# Patient Record
Sex: Male | Born: 1940 | Race: White | Hispanic: No | Marital: Married | Smoking: Current every day smoker
Health system: Southern US, Community
[De-identification: ages and names within clinical notes are randomized; demographics above are authoritative.]

## PROBLEM LIST (undated history)

## (undated) DIAGNOSIS — F419 Anxiety disorder, unspecified: Secondary | ICD-10-CM

## (undated) DIAGNOSIS — J45909 Unspecified asthma, uncomplicated: Secondary | ICD-10-CM

## (undated) DIAGNOSIS — I509 Heart failure, unspecified: Secondary | ICD-10-CM

## (undated) DIAGNOSIS — M1990 Unspecified osteoarthritis, unspecified site: Secondary | ICD-10-CM

## (undated) DIAGNOSIS — J439 Emphysema, unspecified: Secondary | ICD-10-CM

## (undated) DIAGNOSIS — K219 Gastro-esophageal reflux disease without esophagitis: Secondary | ICD-10-CM

## (undated) DIAGNOSIS — I959 Hypotension, unspecified: Secondary | ICD-10-CM

## (undated) DIAGNOSIS — E119 Type 2 diabetes mellitus without complications: Secondary | ICD-10-CM

## (undated) HISTORY — DX: Unspecified asthma, uncomplicated: J45.909

## (undated) HISTORY — DX: Gastro-esophageal reflux disease without esophagitis: K21.9

## (undated) HISTORY — DX: Anxiety disorder, unspecified: F41.9

## (undated) HISTORY — DX: Unspecified osteoarthritis, unspecified site: M19.90

## (undated) HISTORY — DX: Emphysema, unspecified: J43.9

## (undated) HISTORY — DX: Hypotension, unspecified: I95.9

## (undated) HISTORY — DX: Type 2 diabetes mellitus without complications: E11.9

## (undated) HISTORY — DX: Heart failure, unspecified: I50.9

---

## 2004-12-13 HISTORY — PX: HEMORRHOID SURGERY: SHX153

## 2010-03-24 ENCOUNTER — Ambulatory Visit: Payer: Self-pay | Admitting: Ophthalmology

## 2010-08-16 ENCOUNTER — Ambulatory Visit: Payer: Self-pay | Admitting: Internal Medicine

## 2013-01-17 ENCOUNTER — Ambulatory Visit: Payer: Self-pay | Admitting: Ophthalmology

## 2014-01-28 ENCOUNTER — Ambulatory Visit: Payer: Self-pay | Admitting: Nephrology

## 2014-08-15 ENCOUNTER — Ambulatory Visit: Payer: Self-pay | Admitting: Emergency Medicine

## 2014-10-20 IMAGING — CT CT ABD-PELV W/O CM
2 of 4 series · 16 of 46 positions shown, 18 images · non-contrast
Comparison: None.

CLINICAL DATA: Right renal cysts.

EXAM:
CT ABDOMEN AND PELVIS WITHOUT CONTRAST
TECHNIQUE: Multidetector CT imaging of the abdomen and pelvis was performed
following the standard protocol without intravenous contrast.

[Series 2: routine without · axial · non-contrast · 0.84mm/px · z∈[-1118,-658]mm · 13 of 102 slices shown, 15 images]
[im 5/102  soft-tissue]
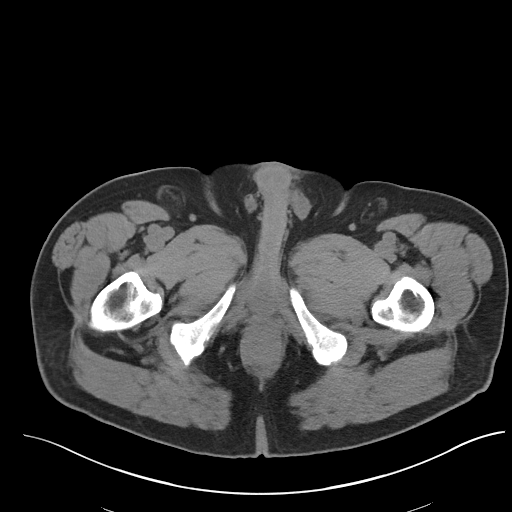
[im 5/102  bone]
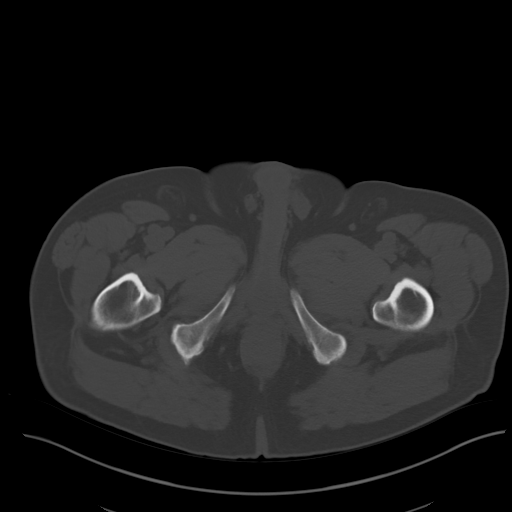
[im 14/102  soft-tissue]
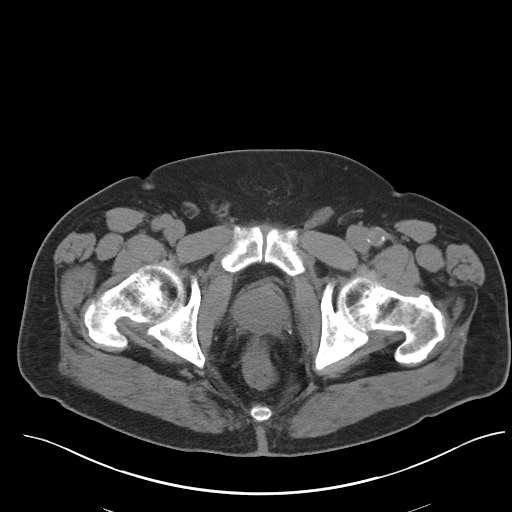
[im 22/102  soft-tissue]
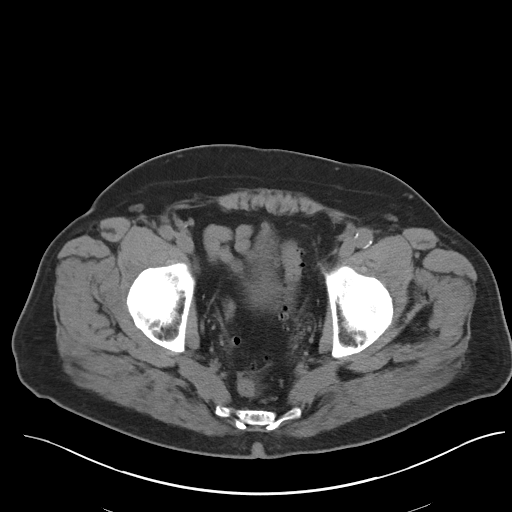
[im 27/102  soft-tissue]
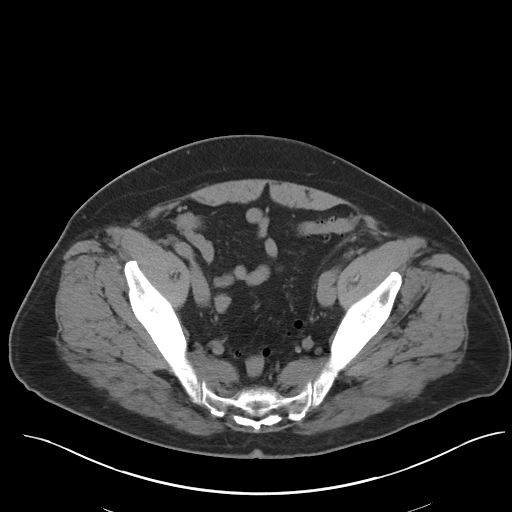
[im 36/102  soft-tissue]
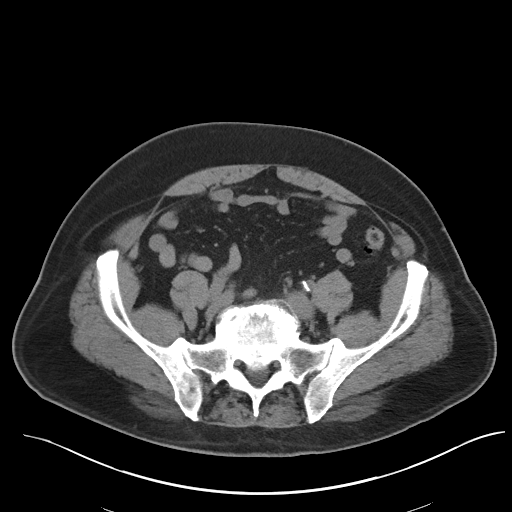
[im 44/102  soft-tissue]
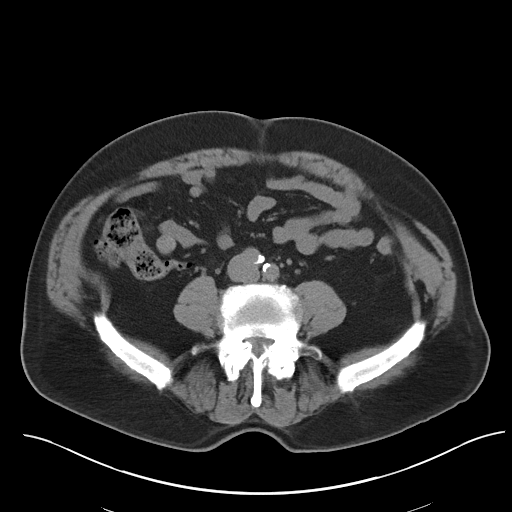
[im 53/102  soft-tissue]
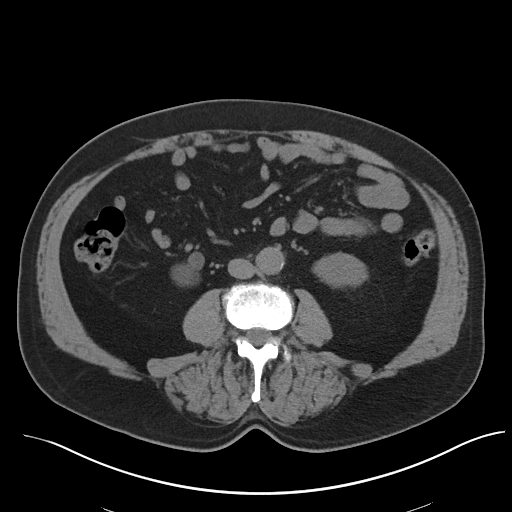
[im 58/102  soft-tissue]
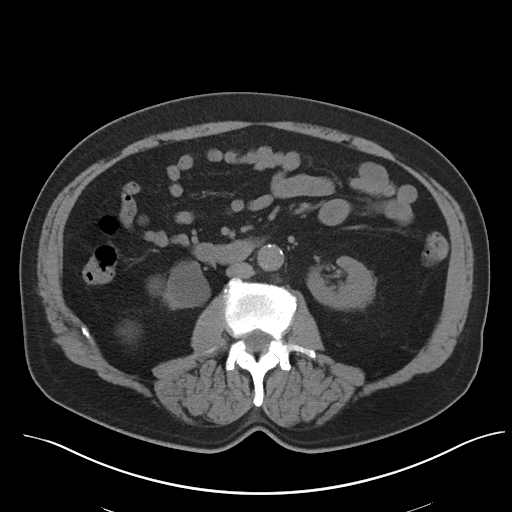
[im 66/102  soft-tissue]
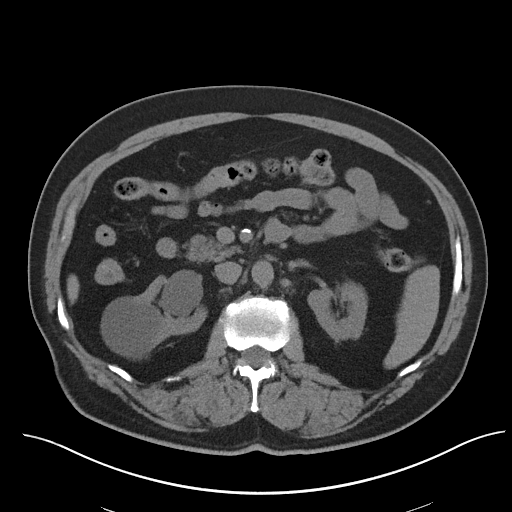
[im 66/102  bone]
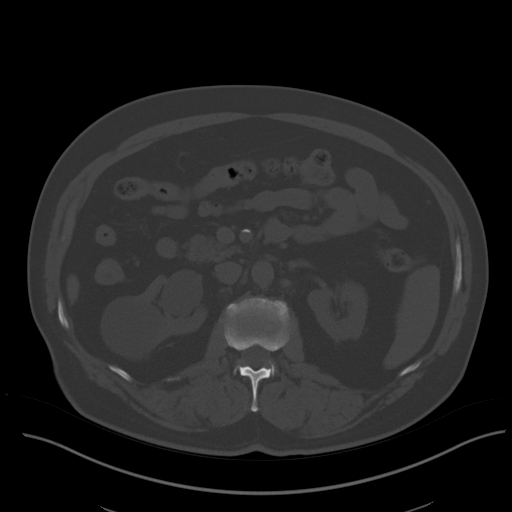
[im 75/102  soft-tissue]
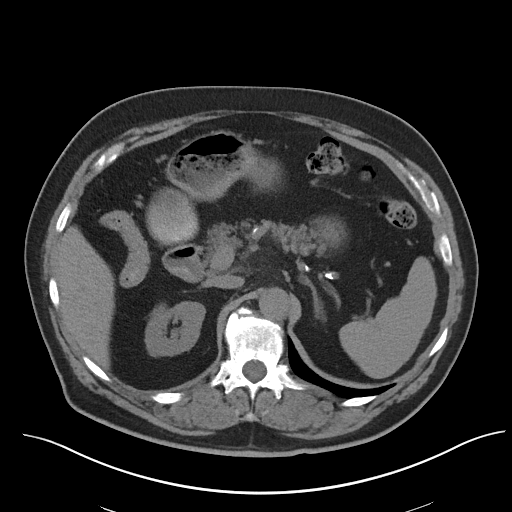
[im 80/102  soft-tissue]
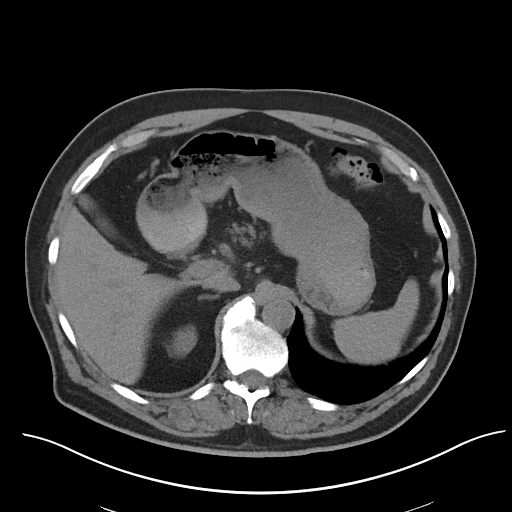
[im 88/102  soft-tissue]
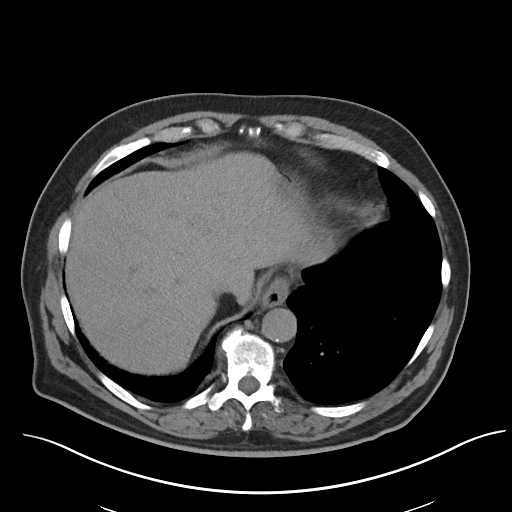
[im 97/102  soft-tissue]
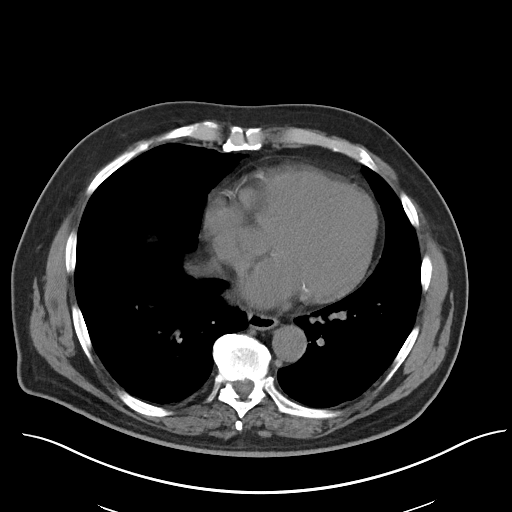

[Series 5: cor routine without · coronal · non-contrast · 0.88mm/px · 3 of 158 slices shown]
[im 53/158  soft-tissue]
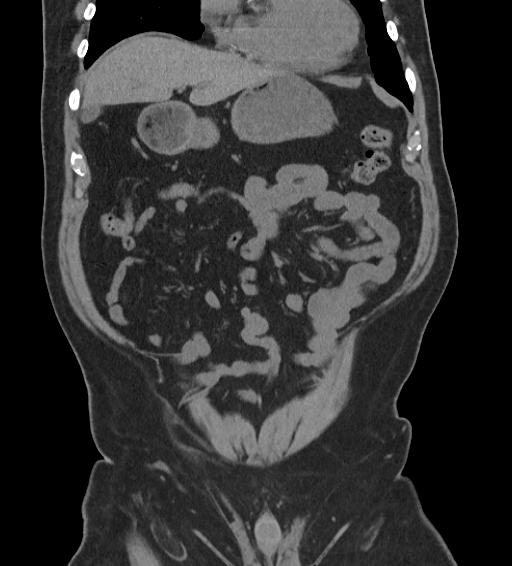
[im 70/158  soft-tissue]
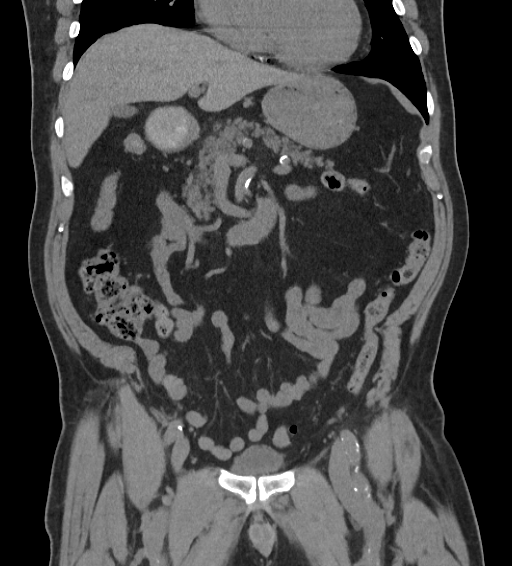
[im 88/158  soft-tissue]
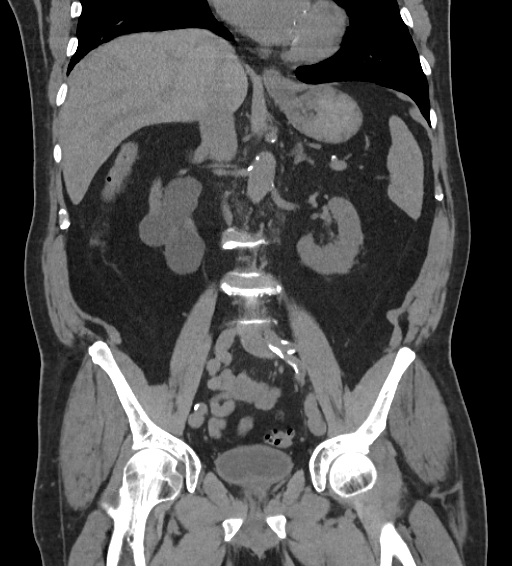

[16 of 46 positions shown; findings below may reference images not displayed]

FINDINGS: No focal abnormality is seen in the liver or spleen on this study
performed without intravenous contrast material. The stomach,
duodenum, pancreas, gallbladder, and adrenal glands are
unremarkable.

3.8 cm cystic lesion is identified in the central sinus of the right
kidney. 5.8 cm exophytic water density lesion is seen in the
interpolar right kidney. 2.6 cm exophytic cyst identified in the
lower pole of the right kidney. 4.3 cm cystic lesion identified at
the extreme lower pole of the right kidney.

No definite cysts are seen in the left kidney.

Insert athero aorta No abdominal lymphadenopathy. No free fluid in
the abdomen.

Imaging through the pelvis shows trace free intraperitoneal fluid.
No pelvic sidewall lymphadenopathy. Bladder is unremarkable.
Prostate gland is enlarged.

Diverticular changes are seen in the left colon without evidence of
diverticulitis. Terminal ileum is normal. The appendix is normal.

Bone windows reveal no worrisome lytic or sclerotic osseous lesions.
IMPRESSION: Multiple cystic lesions in the right kidney. These measure water
density but cannot be definitively characterized without intravenous
contrast material. Correlation to the previous ultrasound exam might
be useful to ensure stability in size.

Atherosclerosis of the abdominal aorta without aneurysm.

Colonic diverticulosis.

## 2014-10-23 DIAGNOSIS — I10 Essential (primary) hypertension: Secondary | ICD-10-CM | POA: Insufficient documentation

## 2014-10-23 DIAGNOSIS — K219 Gastro-esophageal reflux disease without esophagitis: Secondary | ICD-10-CM | POA: Insufficient documentation

## 2015-05-28 ENCOUNTER — Ambulatory Visit: Payer: Self-pay

## 2015-06-12 ENCOUNTER — Encounter: Payer: Self-pay | Admitting: Urology

## 2015-06-12 ENCOUNTER — Ambulatory Visit (INDEPENDENT_AMBULATORY_CARE_PROVIDER_SITE_OTHER): Payer: PPO | Admitting: Urology

## 2015-06-12 VITALS — BP 115/72 | HR 69 | Ht 72.0 in | Wt 215.9 lb

## 2015-06-12 DIAGNOSIS — Q6102 Congenital multiple renal cysts: Secondary | ICD-10-CM | POA: Diagnosis not present

## 2015-06-12 NOTE — Progress Notes (Signed)
I have been asked to see the patient by Dr. Sula Rumpleharanjit Virk, for evaluation and management of complex right sided renal cysts.  History of present illness: Antonio Larson seen today for right renal cysts.  These were last imaged in Feb 2016 with non-contrast CT scan.  The CT demonstrated multiple cysts, not completely characterized, but mostly simple in nature.  Several cysts are peri-pelvic.   The patient states that these cysts were initially discovered with an ultrasound of performed summer of 2015. The patient also relates that 3 weeks ago [facility] was [facility] for [facility]. Ultimately, the patient was diagnosed with acute pancreatitis. He was discharged from the emergency room. The silver lining was that he did have a CT scan with IV contrast. This more definitively characterize these cysts on the right kidney as being simple cyst. I was not able to review the images, I did read the report.  The patient states that he has had no right-sided flank pain. He has had no hematuria.  He has no urologic history, he voids well and feels that he empties his bladder completely. He is not having history of recurrent urinary tract infections.  Review of systems: A 12 point comprehensive review of systems was obtained and is negative unless otherwise stated in the history of present illness.  There are no active problems to display for this patient.   No current outpatient prescriptions on file prior to visit.   No current facility-administered medications on file prior to visit.    No past medical history on file.  No past surgical history on file.  History  Substance Use Topics  . Smoking status: Not on file  . Smokeless tobacco: Not on file  . Alcohol Use: Not on file    No family history on file.  PE: There were no vitals filed for this visit. Patient appears to be in no acute distress  patient is alert and oriented x3 Atraumatic normocephalic head No cervical or  supraclavicular lymphadenopathy appreciated No increased work of breathing, no audible wheezes/rhonchi Regular sinus rhythm/rate Abdomen is soft, nontender, nondistended, no CVA or suprapubic tenderness Lower extremities are symmetric without appreciable edema Grossly neurologically intact No identifiable skin lesions  No results for input(s): WBC, HGB, HCT in the last 72 hours. No results for input(s): NA, K, CL, CO2, GLUCOSE, BUN, CREATININE, CALCIUM in the last 72 hours. No results for input(s): LABPT, INR in the last 72 hours. No results for input(s): LABURIN in the last 72 hours. No results found for this or any previous visit.  Imaging: I independently reviewed the patient's CT scan from February 2016. I also read the reports from [facility].  Imp: The patient has simple cysts in the right kidney. These are benign-appearing.  Recommendations: I reassured the patient in regards to renal cysts. Although I was unable to view the images from [facility], they don't mention anything about malignant appearing or complex-appearing cyst. A CT scan from February based on my read was similar. As such, the patient needs no additional follow-up for the cysts. I suspect, over time the patient will have additional CT scans and we will be also follow these indirectly. I did not ordered anything today, the patient will contact us if he would like to proceed with an MRI to more definitively characterize them. Otherwise, we will follow up on an as-needed basis.  Cc: Dr. Antonieta Lovelessharanjit, Winfred LeedsVirk  HERRICK,  BENJAMIN W

## 2015-06-13 LAB — URINALYSIS, COMPLETE
Bilirubin, UA: NEGATIVE
GLUCOSE, UA: NEGATIVE
Leukocytes, UA: NEGATIVE
NITRITE UA: NEGATIVE
PH UA: 5 (ref 5.0–7.5)
RBC UA: NEGATIVE
Specific Gravity, UA: >1.03 — ABNORMAL HIGH (ref 1.005–1.030)
Urobilinogen, Ur: 0.2 mg/dL (ref 0.2–1.0)

## 2015-06-13 LAB — MICROSCOPIC EXAMINATION: BACTERIA UA: NONE SEEN

## 2015-12-17 DIAGNOSIS — E872 Acidosis: Secondary | ICD-10-CM | POA: Diagnosis not present

## 2015-12-17 DIAGNOSIS — N183 Chronic kidney disease, stage 3 (moderate): Secondary | ICD-10-CM | POA: Diagnosis not present

## 2015-12-17 DIAGNOSIS — N2581 Secondary hyperparathyroidism of renal origin: Secondary | ICD-10-CM | POA: Diagnosis not present

## 2015-12-17 DIAGNOSIS — E559 Vitamin D deficiency, unspecified: Secondary | ICD-10-CM | POA: Diagnosis not present

## 2015-12-17 DIAGNOSIS — N281 Cyst of kidney, acquired: Secondary | ICD-10-CM | POA: Diagnosis not present

## 2015-12-17 DIAGNOSIS — F5101 Primary insomnia: Secondary | ICD-10-CM | POA: Diagnosis not present

## 2016-01-24 ENCOUNTER — Ambulatory Visit
Admission: EM | Admit: 2016-01-24 | Discharge: 2016-01-24 | Disposition: A | Discharge status: Home / Self Care | Payer: Self-pay

## 2016-01-27 DIAGNOSIS — J0100 Acute maxillary sinusitis, unspecified: Secondary | ICD-10-CM | POA: Diagnosis not present

## 2016-01-27 DIAGNOSIS — H40003 Preglaucoma, unspecified, bilateral: Secondary | ICD-10-CM | POA: Diagnosis not present

## 2016-02-13 DIAGNOSIS — J0100 Acute maxillary sinusitis, unspecified: Secondary | ICD-10-CM | POA: Diagnosis not present

## 2016-03-05 DIAGNOSIS — F5101 Primary insomnia: Secondary | ICD-10-CM | POA: Diagnosis not present

## 2016-03-05 DIAGNOSIS — J0100 Acute maxillary sinusitis, unspecified: Secondary | ICD-10-CM | POA: Diagnosis not present

## 2016-03-15 DIAGNOSIS — R0602 Shortness of breath: Secondary | ICD-10-CM | POA: Diagnosis not present

## 2016-03-15 DIAGNOSIS — J441 Chronic obstructive pulmonary disease with (acute) exacerbation: Secondary | ICD-10-CM | POA: Diagnosis not present

## 2016-03-15 DIAGNOSIS — R05 Cough: Secondary | ICD-10-CM | POA: Diagnosis not present

## 2016-03-17 DIAGNOSIS — I272 Other secondary pulmonary hypertension: Secondary | ICD-10-CM | POA: Diagnosis not present

## 2016-03-17 DIAGNOSIS — I5023 Acute on chronic systolic (congestive) heart failure: Secondary | ICD-10-CM | POA: Diagnosis not present

## 2016-03-17 DIAGNOSIS — R410 Disorientation, unspecified: Secondary | ICD-10-CM | POA: Diagnosis not present

## 2016-03-17 DIAGNOSIS — R531 Weakness: Secondary | ICD-10-CM | POA: Diagnosis not present

## 2016-03-17 DIAGNOSIS — I130 Hypertensive heart and chronic kidney disease with heart failure and stage 1 through stage 4 chronic kidney disease, or unspecified chronic kidney disease: Secondary | ICD-10-CM | POA: Diagnosis not present

## 2016-03-17 DIAGNOSIS — F411 Generalized anxiety disorder: Secondary | ICD-10-CM | POA: Diagnosis not present

## 2016-03-17 DIAGNOSIS — H9190 Unspecified hearing loss, unspecified ear: Secondary | ICD-10-CM | POA: Diagnosis not present

## 2016-03-17 DIAGNOSIS — K761 Chronic passive congestion of liver: Secondary | ICD-10-CM | POA: Diagnosis not present

## 2016-03-17 DIAGNOSIS — J90 Pleural effusion, not elsewhere classified: Secondary | ICD-10-CM | POA: Diagnosis not present

## 2016-03-17 DIAGNOSIS — R05 Cough: Secondary | ICD-10-CM | POA: Diagnosis not present

## 2016-03-17 DIAGNOSIS — E785 Hyperlipidemia, unspecified: Secondary | ICD-10-CM | POA: Diagnosis not present

## 2016-03-17 DIAGNOSIS — K219 Gastro-esophageal reflux disease without esophagitis: Secondary | ICD-10-CM | POA: Diagnosis not present

## 2016-03-17 DIAGNOSIS — J9811 Atelectasis: Secondary | ICD-10-CM | POA: Diagnosis not present

## 2016-03-17 DIAGNOSIS — R740 Nonspecific elevation of levels of transaminase and lactic acid dehydrogenase [LDH]: Secondary | ICD-10-CM | POA: Diagnosis not present

## 2016-03-17 DIAGNOSIS — B9620 Unspecified Escherichia coli [E. coli] as the cause of diseases classified elsewhere: Secondary | ICD-10-CM | POA: Diagnosis not present

## 2016-03-17 DIAGNOSIS — S301XXA Contusion of abdominal wall, initial encounter: Secondary | ICD-10-CM | POA: Diagnosis not present

## 2016-03-17 DIAGNOSIS — F5101 Primary insomnia: Secondary | ICD-10-CM | POA: Diagnosis not present

## 2016-03-17 DIAGNOSIS — J069 Acute upper respiratory infection, unspecified: Secondary | ICD-10-CM | POA: Diagnosis not present

## 2016-03-17 DIAGNOSIS — F418 Other specified anxiety disorders: Secondary | ICD-10-CM | POA: Diagnosis not present

## 2016-03-17 DIAGNOSIS — I2582 Chronic total occlusion of coronary artery: Secondary | ICD-10-CM | POA: Diagnosis not present

## 2016-03-17 DIAGNOSIS — K7200 Acute and subacute hepatic failure without coma: Secondary | ICD-10-CM | POA: Diagnosis not present

## 2016-03-17 DIAGNOSIS — F05 Delirium due to known physiological condition: Secondary | ICD-10-CM | POA: Diagnosis not present

## 2016-03-17 DIAGNOSIS — E875 Hyperkalemia: Secondary | ICD-10-CM | POA: Diagnosis not present

## 2016-03-17 DIAGNOSIS — R570 Cardiogenic shock: Secondary | ICD-10-CM | POA: Diagnosis not present

## 2016-03-17 DIAGNOSIS — I2510 Atherosclerotic heart disease of native coronary artery without angina pectoris: Secondary | ICD-10-CM | POA: Diagnosis not present

## 2016-03-17 DIAGNOSIS — D696 Thrombocytopenia, unspecified: Secondary | ICD-10-CM | POA: Diagnosis not present

## 2016-03-17 DIAGNOSIS — I255 Ischemic cardiomyopathy: Secondary | ICD-10-CM | POA: Diagnosis not present

## 2016-03-17 DIAGNOSIS — N189 Chronic kidney disease, unspecified: Secondary | ICD-10-CM | POA: Diagnosis not present

## 2016-03-17 DIAGNOSIS — N179 Acute kidney failure, unspecified: Secondary | ICD-10-CM | POA: Diagnosis not present

## 2016-03-17 DIAGNOSIS — N390 Urinary tract infection, site not specified: Secondary | ICD-10-CM | POA: Diagnosis not present

## 2016-03-18 DIAGNOSIS — I5023 Acute on chronic systolic (congestive) heart failure: Secondary | ICD-10-CM | POA: Diagnosis not present

## 2016-03-19 DIAGNOSIS — I5023 Acute on chronic systolic (congestive) heart failure: Secondary | ICD-10-CM | POA: Diagnosis not present

## 2016-03-19 DIAGNOSIS — I2510 Atherosclerotic heart disease of native coronary artery without angina pectoris: Secondary | ICD-10-CM | POA: Diagnosis not present

## 2016-03-19 DIAGNOSIS — R570 Cardiogenic shock: Secondary | ICD-10-CM | POA: Diagnosis not present

## 2016-03-19 DIAGNOSIS — I2789 Other specified pulmonary heart diseases: Secondary | ICD-10-CM | POA: Diagnosis not present

## 2016-03-20 DIAGNOSIS — Z95828 Presence of other vascular implants and grafts: Secondary | ICD-10-CM | POA: Diagnosis not present

## 2016-03-20 DIAGNOSIS — R570 Cardiogenic shock: Secondary | ICD-10-CM | POA: Diagnosis not present

## 2016-03-20 DIAGNOSIS — I5023 Acute on chronic systolic (congestive) heart failure: Secondary | ICD-10-CM | POA: Diagnosis not present

## 2016-03-20 DIAGNOSIS — J449 Chronic obstructive pulmonary disease, unspecified: Secondary | ICD-10-CM | POA: Diagnosis not present

## 2016-03-20 DIAGNOSIS — N178 Other acute kidney failure: Secondary | ICD-10-CM | POA: Diagnosis not present

## 2016-03-20 DIAGNOSIS — I2510 Atherosclerotic heart disease of native coronary artery without angina pectoris: Secondary | ICD-10-CM | POA: Diagnosis not present

## 2016-03-20 DIAGNOSIS — I517 Cardiomegaly: Secondary | ICD-10-CM | POA: Diagnosis not present

## 2016-03-20 DIAGNOSIS — I255 Ischemic cardiomyopathy: Secondary | ICD-10-CM | POA: Diagnosis not present

## 2016-03-21 DIAGNOSIS — I10 Essential (primary) hypertension: Secondary | ICD-10-CM | POA: Diagnosis not present

## 2016-03-21 DIAGNOSIS — R918 Other nonspecific abnormal finding of lung field: Secondary | ICD-10-CM | POA: Diagnosis not present

## 2016-03-22 DIAGNOSIS — I517 Cardiomegaly: Secondary | ICD-10-CM | POA: Diagnosis not present

## 2016-03-22 DIAGNOSIS — R570 Cardiogenic shock: Secondary | ICD-10-CM | POA: Diagnosis not present

## 2016-03-22 DIAGNOSIS — I255 Ischemic cardiomyopathy: Secondary | ICD-10-CM | POA: Diagnosis not present

## 2016-03-22 DIAGNOSIS — J811 Chronic pulmonary edema: Secondary | ICD-10-CM | POA: Diagnosis not present

## 2016-03-22 DIAGNOSIS — N178 Other acute kidney failure: Secondary | ICD-10-CM | POA: Diagnosis not present

## 2016-03-22 DIAGNOSIS — J449 Chronic obstructive pulmonary disease, unspecified: Secondary | ICD-10-CM | POA: Diagnosis not present

## 2016-03-23 DIAGNOSIS — I517 Cardiomegaly: Secondary | ICD-10-CM | POA: Diagnosis not present

## 2016-03-23 DIAGNOSIS — R570 Cardiogenic shock: Secondary | ICD-10-CM | POA: Diagnosis not present

## 2016-03-23 DIAGNOSIS — R918 Other nonspecific abnormal finding of lung field: Secondary | ICD-10-CM | POA: Diagnosis not present

## 2016-03-23 DIAGNOSIS — I255 Ischemic cardiomyopathy: Secondary | ICD-10-CM | POA: Diagnosis not present

## 2016-03-24 DIAGNOSIS — R570 Cardiogenic shock: Secondary | ICD-10-CM | POA: Diagnosis not present

## 2016-03-24 DIAGNOSIS — K7200 Acute and subacute hepatic failure without coma: Secondary | ICD-10-CM | POA: Diagnosis not present

## 2016-03-24 DIAGNOSIS — N179 Acute kidney failure, unspecified: Secondary | ICD-10-CM | POA: Diagnosis not present

## 2016-03-24 DIAGNOSIS — I5023 Acute on chronic systolic (congestive) heart failure: Secondary | ICD-10-CM | POA: Diagnosis not present

## 2016-03-24 DIAGNOSIS — R918 Other nonspecific abnormal finding of lung field: Secondary | ICD-10-CM | POA: Diagnosis not present

## 2016-03-24 DIAGNOSIS — E875 Hyperkalemia: Secondary | ICD-10-CM | POA: Diagnosis not present

## 2016-03-24 DIAGNOSIS — I255 Ischemic cardiomyopathy: Secondary | ICD-10-CM | POA: Diagnosis not present

## 2016-03-25 DIAGNOSIS — I255 Ischemic cardiomyopathy: Secondary | ICD-10-CM | POA: Diagnosis not present

## 2016-03-25 DIAGNOSIS — E875 Hyperkalemia: Secondary | ICD-10-CM | POA: Diagnosis not present

## 2016-03-25 DIAGNOSIS — I97410 Intraoperative hemorrhage and hematoma of a circulatory system organ or structure complicating a cardiac catheterization: Secondary | ICD-10-CM | POA: Diagnosis not present

## 2016-03-26 DIAGNOSIS — I959 Hypotension, unspecified: Secondary | ICD-10-CM | POA: Diagnosis not present

## 2016-03-26 DIAGNOSIS — I255 Ischemic cardiomyopathy: Secondary | ICD-10-CM | POA: Diagnosis not present

## 2016-03-26 DIAGNOSIS — I2510 Atherosclerotic heart disease of native coronary artery without angina pectoris: Secondary | ICD-10-CM | POA: Diagnosis not present

## 2016-03-26 DIAGNOSIS — R410 Disorientation, unspecified: Secondary | ICD-10-CM | POA: Diagnosis not present

## 2016-03-27 DIAGNOSIS — I255 Ischemic cardiomyopathy: Secondary | ICD-10-CM | POA: Diagnosis not present

## 2016-03-27 DIAGNOSIS — I959 Hypotension, unspecified: Secondary | ICD-10-CM | POA: Diagnosis not present

## 2016-03-27 DIAGNOSIS — I2510 Atherosclerotic heart disease of native coronary artery without angina pectoris: Secondary | ICD-10-CM | POA: Diagnosis not present

## 2016-03-27 DIAGNOSIS — R410 Disorientation, unspecified: Secondary | ICD-10-CM | POA: Diagnosis not present

## 2016-03-28 DIAGNOSIS — I959 Hypotension, unspecified: Secondary | ICD-10-CM | POA: Diagnosis not present

## 2016-03-28 DIAGNOSIS — R410 Disorientation, unspecified: Secondary | ICD-10-CM | POA: Diagnosis not present

## 2016-03-28 DIAGNOSIS — I2510 Atherosclerotic heart disease of native coronary artery without angina pectoris: Secondary | ICD-10-CM | POA: Diagnosis not present

## 2016-03-28 DIAGNOSIS — I255 Ischemic cardiomyopathy: Secondary | ICD-10-CM | POA: Diagnosis not present

## 2016-03-29 DIAGNOSIS — I2510 Atherosclerotic heart disease of native coronary artery without angina pectoris: Secondary | ICD-10-CM | POA: Diagnosis not present

## 2016-03-29 DIAGNOSIS — R570 Cardiogenic shock: Secondary | ICD-10-CM | POA: Diagnosis not present

## 2016-03-29 DIAGNOSIS — I222 Subsequent non-ST elevation (NSTEMI) myocardial infarction: Secondary | ICD-10-CM | POA: Diagnosis not present

## 2016-03-29 DIAGNOSIS — I5023 Acute on chronic systolic (congestive) heart failure: Secondary | ICD-10-CM | POA: Diagnosis not present

## 2016-03-30 DIAGNOSIS — R570 Cardiogenic shock: Secondary | ICD-10-CM | POA: Diagnosis not present

## 2016-03-30 DIAGNOSIS — I5023 Acute on chronic systolic (congestive) heart failure: Secondary | ICD-10-CM | POA: Diagnosis not present

## 2016-03-30 DIAGNOSIS — I2510 Atherosclerotic heart disease of native coronary artery without angina pectoris: Secondary | ICD-10-CM | POA: Diagnosis not present

## 2016-03-31 DIAGNOSIS — I2510 Atherosclerotic heart disease of native coronary artery without angina pectoris: Secondary | ICD-10-CM | POA: Diagnosis not present

## 2016-03-31 DIAGNOSIS — I5023 Acute on chronic systolic (congestive) heart failure: Secondary | ICD-10-CM | POA: Diagnosis not present

## 2016-03-31 DIAGNOSIS — R570 Cardiogenic shock: Secondary | ICD-10-CM | POA: Diagnosis not present

## 2016-04-12 DIAGNOSIS — I2510 Atherosclerotic heart disease of native coronary artery without angina pectoris: Secondary | ICD-10-CM | POA: Diagnosis not present

## 2016-04-12 DIAGNOSIS — J449 Chronic obstructive pulmonary disease, unspecified: Secondary | ICD-10-CM | POA: Diagnosis not present

## 2016-04-12 DIAGNOSIS — N179 Acute kidney failure, unspecified: Secondary | ICD-10-CM | POA: Diagnosis not present

## 2016-04-12 DIAGNOSIS — I5023 Acute on chronic systolic (congestive) heart failure: Secondary | ICD-10-CM | POA: Diagnosis not present

## 2016-05-03 DIAGNOSIS — I5023 Acute on chronic systolic (congestive) heart failure: Secondary | ICD-10-CM | POA: Diagnosis not present

## 2016-05-03 DIAGNOSIS — I255 Ischemic cardiomyopathy: Secondary | ICD-10-CM | POA: Diagnosis not present

## 2016-05-03 DIAGNOSIS — I2510 Atherosclerotic heart disease of native coronary artery without angina pectoris: Secondary | ICD-10-CM | POA: Diagnosis not present

## 2016-05-04 DIAGNOSIS — Z9861 Coronary angioplasty status: Secondary | ICD-10-CM | POA: Diagnosis not present

## 2016-05-06 DIAGNOSIS — Z9861 Coronary angioplasty status: Secondary | ICD-10-CM | POA: Diagnosis not present

## 2016-05-13 DIAGNOSIS — Z9861 Coronary angioplasty status: Secondary | ICD-10-CM | POA: Diagnosis not present

## 2016-05-18 DIAGNOSIS — Z9861 Coronary angioplasty status: Secondary | ICD-10-CM | POA: Diagnosis not present

## 2016-05-20 DIAGNOSIS — Z9861 Coronary angioplasty status: Secondary | ICD-10-CM | POA: Diagnosis not present

## 2016-05-21 ENCOUNTER — Other Ambulatory Visit: Payer: Self-pay | Admitting: *Deleted

## 2016-05-21 DIAGNOSIS — K219 Gastro-esophageal reflux disease without esophagitis: Secondary | ICD-10-CM | POA: Diagnosis not present

## 2016-05-21 DIAGNOSIS — F3341 Major depressive disorder, recurrent, in partial remission: Secondary | ICD-10-CM | POA: Diagnosis not present

## 2016-05-21 DIAGNOSIS — J449 Chronic obstructive pulmonary disease, unspecified: Secondary | ICD-10-CM | POA: Diagnosis not present

## 2016-05-21 DIAGNOSIS — I5023 Acute on chronic systolic (congestive) heart failure: Secondary | ICD-10-CM | POA: Diagnosis not present

## 2016-05-21 DIAGNOSIS — I2510 Atherosclerotic heart disease of native coronary artery without angina pectoris: Secondary | ICD-10-CM | POA: Diagnosis not present

## 2016-05-21 DIAGNOSIS — I10 Essential (primary) hypertension: Secondary | ICD-10-CM | POA: Diagnosis not present

## 2016-05-21 NOTE — Patient Outreach (Signed)
[facility](THN) Care Management  05/21/2016  Antonio Larson July 19, 1941 [id number]   Subjective: Telephone call to patient's home number, spoke with wife, states patient is not currently home, left HIPAA compliant message for patient with wife, and requested call back.  Objective: Per chart review: Patient has not had recent hospitalization.  Assessment: Received [facility] referral on 05/13/16.   Referral source: Antonio Larson.  Referral reason: New congestive heart failure diagnosis, disease, and symptom management.   Patient had 1 ED visit in last 6 months.   Last hospitalization discharge on 03/31/16.    Services requested: [facility] RN[facility].   Telephone screen completion, pending patient contact.   Plan: RNCM will call patient for 2nd outreach attempt, telephone screen, within 1 week.  Antonio Ivins H. Gardiner Barefootooper RN, BSN, CCM [facility] Care [facility] Telephonic CM Phone: [phone] Fax: [phone]

## 2016-05-25 ENCOUNTER — Other Ambulatory Visit: Payer: Self-pay | Admitting: *Deleted

## 2016-05-25 DIAGNOSIS — Z9861 Coronary angioplasty status: Secondary | ICD-10-CM | POA: Diagnosis not present

## 2016-05-25 NOTE — Patient Outreach (Addendum)
[facility](THN) Care Management  05/25/2016  Shirlee LimerickWillie J Dawes 06/09/1941 [id number]  Subjective: Telephone call to patient's home number, spoke with wife, states patient is not currently home, left HIPAA compliant message for patient with wife, and requested call back.    Wife states RNCM may also call patient back in the morning prior to him going outside to work.   Objective: Per chart review: Patient has not had recent hospitalization.  Assessment: Received [facility] referral on 05/13/16. Referral source: Kathrine HaddockYuvongala Howell. Referral reason: New congestive heart failure diagnosis, disease, and symptom management. Patient had 1 ED visit in last 6 months. Last hospitalization discharge on 03/31/16. Services requested[facility]. Telephone screen completion, pending patient contact.   Plan: RNCM will call patient for 3rd outreach attempt, telephone screen, within 1 week.  Louna Rothgeb H. Gardiner Barefootooper RN, BSN, CCM [facility] Care Management [facility] Telephonic CM Phone: [phone] Fax: [phone]

## 2016-05-26 ENCOUNTER — Other Ambulatory Visit: Payer: Self-pay | Admitting: *Deleted

## 2016-05-26 DIAGNOSIS — I5041 Acute combined systolic (congestive) and diastolic (congestive) heart failure: Secondary | ICD-10-CM

## 2016-05-26 NOTE — Patient Outreach (Signed)
[facility](THN) Care Management  05/26/2016  Shirlee LimerickWillie J Tech Feb 01, 1941 [id number]  Subjective: Telephone call to patient's home number, spoke with patient, and HIPAA verified.   Patient states he is doing ok.   [facility] to speake with son Joellyn Rued(Billy Trumbull) and wife Geraldine Solar(Lillie Pellecchia) regarding healthcare needs as needed.  Discussed [facility] program and patient in agreement to receive services.  Patient states he does not have any medication, care coordination, or transportation needs at this time.   States his son Genevie CheshireBilly assist with transportation, home management, and whatever else he needs.  Patient states he ambulates without assistive devices, may use a walking stick every now and then.  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    Patient in agreement to [facility] Health Coach referral for  congestive heart failure disease management, monitoring, and education.    Objective: Per chart review: Patient has not had recent hospitalization.  Assessment: Received [facility] referral on 05/13/16. Referral source: Kathrine HaddockYuvongala Howell. Referral reason: New congestive heart failure diagnosis, disease, and symptom management. Patient had 1 ED visit in last 6 months. Last hospitalization discharge on 03/31/16. Services requested: [facility] RN[facility]. Telephone screen completed and patient will receive [facility] services.   Plan: RNCM will refer patient to [facility] Care [facility] Health Coach for congestive heart failure disease management, monitoring, and education.   Celina Shiley H. Gardiner Barefootooper RN, BSN, CCM [facility] Care Management [facility] Telephonic CM Phone: [phone] Fax: [phone]

## 2016-05-28 DIAGNOSIS — Z9861 Coronary angioplasty status: Secondary | ICD-10-CM | POA: Diagnosis not present

## 2016-05-31 ENCOUNTER — Other Ambulatory Visit: Payer: Self-pay | Admitting: *Deleted

## 2016-05-31 NOTE — Patient Outreach (Signed)
[facility](THN) Care Management  05/31/2016  Antonio Larson 04/20/1941 [id number]  Subjective:  Received voicemail message from patient's son Antonio Larson(Billy Kittle), states he is calling in reference to his father Antonio Larson and request call back on mobile number [phone]).   Patient has given RNCM authorization to speak with son regarding healthcare needs as needed during previous conversation.   Patient has been referred to [facility] Care Management Health Coach for COPD disease management.  Telephone call to [facility] Health Coach Scarlette Calico(Frances Pleasant), advised of above voicemail and states she will follow up with son.    Objective: Per chart review: Patient has not had recent hospitalization.  Assessment: Received [facility] referral on 05/13/16. Referral source: Kathrine HaddockYuvongala Howell. Referral reason: New congestive heart failure diagnosis, disease, and symptom management. Patient had 1 ED visit in last 6 months. Last hospitalization discharge on 03/31/16. Services requested: [facility] RNCM. Telephone screen completed and patient will receive [facility] Care Management services.  No further Telephonic RNCM needs at this time.   Plan: [facility] Health Coach will follow up with patient's son per patient's previous request.  Patient will receive [facility] Health Coach services for congestive heart failure disease management, monitoring, and education.   Antonio Larson H. Gardiner Barefootooper RN, BSN, CCM [facility] Care Management [facility] Telephonic CM Phone: [phone] Fax: [phone]

## 2016-06-01 DIAGNOSIS — Z9861 Coronary angioplasty status: Secondary | ICD-10-CM | POA: Diagnosis not present

## 2016-06-03 DIAGNOSIS — Z9861 Coronary angioplasty status: Secondary | ICD-10-CM | POA: Diagnosis not present

## 2016-06-08 DIAGNOSIS — Z9861 Coronary angioplasty status: Secondary | ICD-10-CM | POA: Diagnosis not present

## 2016-06-10 DIAGNOSIS — Z9861 Coronary angioplasty status: Secondary | ICD-10-CM | POA: Diagnosis not present

## 2016-06-14 DIAGNOSIS — Z9861 Coronary angioplasty status: Secondary | ICD-10-CM | POA: Diagnosis not present

## 2016-06-17 ENCOUNTER — Other Ambulatory Visit: Payer: Self-pay | Admitting: *Deleted

## 2016-06-17 ENCOUNTER — Encounter: Payer: Self-pay | Admitting: *Deleted

## 2016-06-17 DIAGNOSIS — Z9861 Coronary angioplasty status: Secondary | ICD-10-CM | POA: Diagnosis not present

## 2016-06-17 NOTE — Patient Outreach (Signed)
[facility]N) Care Management  [facility] Care [facility]  06/17/2016   Antonio Larson Nov 23, 1941 [id number]  Subjective: RN Health Coach telephone call to patient.  Hipaa compliance verified. Per patient He goes to rehab twice a week. Per patient he is exercising doing walking and on a pedaling machine and some strengthening. Per patient he is unable to read. Patient stated I don't have much education.  Patient stated that Antonio Larson son fixes his medications and does all his finances.Per patient he does not know about the zones of Congestive Heart Failure. RN Health Coach had  Returned call to  [place] on  06/16/2016. Per Antonio Larson he is the caregiver for both his parent. Antonio Larson wanted his father called back and discuss the program with him.  Antonio Larson son understood the services available .Antonio Larson is POA and wanted his father in the program. Patient has a cane but doesn't use it much. 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 Patient understands that he has congestive heart failure. Per patient he has no problems with a few steps. He has not had to go up stairs in his house. 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 Patient appetite is good and he is trying to eat a low sodium diet. Patient has agreed to the RN calling him once a month.  Objective:   Encounter Medications:  Outpatient Encounter Prescriptions as of 06/17/2016  Medication Sig Note  . aspirin EC 81 MG tablet Take by mouth. 06/12/2015: [facility]  . enalapril (VASOTEC) 2.5 MG tablet Take by mouth. 06/12/2015: [facility]  . ferrous sulfate 325 (65 FE) MG tablet Take by mouth. 06/12/2015: [facility]  . FLUoxetine (PROZAC) 20 MG capsule Take by mouth. 06/12/2015: [facility]  . oxyCODONE (OXY IR/ROXICODONE) 5 MG immediate release tablet TAKE 1 TABLET BY MOPUTH  EVERY 4 HOURS AS NEEDED FOR PAIN 06/12/2015: Received from: External Pharmacy  . pantoprazole (PROTONIX) 40 MG tablet Take by mouth. 06/12/2015: [facility]  . salmeterol (SEREVENT DISKUS) 50 MCG/DOSE diskus inhaler Inhale into the lungs. 06/12/2015: [facility]  . sodium bicarbonate 650 MG tablet Take by mouth. 06/12/2015: [facility]   No facility-administered encounter medications on file as of 06/17/2016.    Functional Status:  In your present state of health, do you have any difficulty performing the following activities: 06/17/2016  Vision? N  Difficulty concentrating or making decisions? N  Walking or climbing stairs? Y  Dressing or bathing? N  Doing errands, shopping? Y  Preparing Food and eating ? N  Using the Toilet? N  In the past six months, have you accidently leaked urine? N  Do you have problems with loss of bowel control? N  Managing your Medications? Y  Managing your Finances? Y  Housekeeping or managing your Housekeeping? Y    Fall/Depression Screening: PHQ 2/9 Scores 06/17/2016  PHQ - 2 Score 0    Assessment:  Patient is unable to read Patient does not know about the zones of CHF Patient will benefit from Health Coach telephonic outreach for education and support for Congestive Heart Failure self management. [facility] CM Care Plan Problem One        Most Recent Value   Care Plan Problem One  Knowledge deficit in self management of congestive  heart failure   Role Documenting the Problem One  Health Coach   Care Plan for Problem One  Active   [facility] Long Term Goal (31-90 days)  Patient will not have any admissions in the next 90 days for congestive heart failure   [facility] Long Term Goal Start Date  06/17/16   [facility] CM Short Term Goal #1 (0-30 days)  Patient will be able to verbalize 3 symptoms in the yellow zone within 30 days   [facility] CM Short Term Goal #1 Start Date  06/17/16    Interventions for Short Term Goal #1  RN will send a magnet for the refrigerator of zones of CHF. RN will go over the zones with the patient. RN will follow up within 30 days to add  additional symptoms    [facility] CM Short Term Goal #2 (0-30 days)  Patient will be able to verbalize 3 action plans to the 3 symptoms within 30 days   [facility] CM Short Term Goal #2 Start Date  06/17/16   Interventions for Short Term Goal #2  RN will introduce and discuss with patient 3 action plans for 3 symptoms at a time. RN will use discussion and teach back   [facility] CM Short Term Goal #3 (0-30 days)  patient will be able to sate some food high and low in sodium within 30 days   [facility] CM Short Term Goal #3 Start Date  06/17/16   Interventions for Short Tern Goal #3  RN will send a picture chart of foods high and low in sodium. RN will follow up with discussion and teach back within 30 days      Plan:  RN will send patient a congestive heart magnet with the zones and action plan RN will send a 24 hr nurse  Advice line magnet RN will discuss 3 symptoms of the yellow zone and 3 action plans at a time RN will send a picture chart of foods high and low in sodium Rn will follow up within 30 days for discussion and teach back  Gean MaidensFrances Kaliya Shreiner BSN RN [facility] Care Management [phone]

## 2016-06-22 DIAGNOSIS — Z9861 Coronary angioplasty status: Secondary | ICD-10-CM | POA: Diagnosis not present

## 2016-06-24 DIAGNOSIS — Z9861 Coronary angioplasty status: Secondary | ICD-10-CM | POA: Diagnosis not present

## 2016-06-28 DIAGNOSIS — Z9861 Coronary angioplasty status: Secondary | ICD-10-CM | POA: Diagnosis not present

## 2016-06-30 DIAGNOSIS — Z9861 Coronary angioplasty status: Secondary | ICD-10-CM | POA: Diagnosis not present

## 2016-07-06 DIAGNOSIS — Z9861 Coronary angioplasty status: Secondary | ICD-10-CM | POA: Diagnosis not present

## 2016-07-08 DIAGNOSIS — Z9861 Coronary angioplasty status: Secondary | ICD-10-CM | POA: Diagnosis not present

## 2016-07-13 DIAGNOSIS — Z9861 Coronary angioplasty status: Secondary | ICD-10-CM | POA: Diagnosis not present

## 2016-07-15 ENCOUNTER — Other Ambulatory Visit: Payer: Self-pay | Admitting: *Deleted

## 2016-07-15 DIAGNOSIS — Z9861 Coronary angioplasty status: Secondary | ICD-10-CM | POA: Diagnosis not present

## 2016-07-15 NOTE — Patient Outreach (Signed)
[facility]) Care Management  07/15/2016  Antonio Larson 02/12/1941 [id number]    RN Health Coach attempted #1  Follow up outreach call to patient.  Patient was unavailable. HIPPA compliance voicemail message was left with return callback number.  Plan: RN will call patient again within 14 days.    Gean Maidens BSN RN [facility] Care Management [phone]

## 2016-07-19 ENCOUNTER — Ambulatory Visit: Payer: Self-pay | Admitting: *Deleted

## 2016-07-19 DIAGNOSIS — R0609 Other forms of dyspnea: Secondary | ICD-10-CM | POA: Diagnosis not present

## 2016-07-19 DIAGNOSIS — I5023 Acute on chronic systolic (congestive) heart failure: Secondary | ICD-10-CM | POA: Diagnosis not present

## 2016-07-19 DIAGNOSIS — I517 Cardiomegaly: Secondary | ICD-10-CM | POA: Diagnosis not present

## 2016-07-19 DIAGNOSIS — I110 Hypertensive heart disease with heart failure: Secondary | ICD-10-CM | POA: Diagnosis not present

## 2016-07-19 DIAGNOSIS — I2510 Atherosclerotic heart disease of native coronary artery without angina pectoris: Secondary | ICD-10-CM | POA: Diagnosis not present

## 2016-07-19 DIAGNOSIS — I255 Ischemic cardiomyopathy: Secondary | ICD-10-CM | POA: Diagnosis not present

## 2016-07-19 DIAGNOSIS — Z79899 Other long term (current) drug therapy: Secondary | ICD-10-CM | POA: Diagnosis not present

## 2016-07-19 DIAGNOSIS — Z87891 Personal history of nicotine dependence: Secondary | ICD-10-CM | POA: Diagnosis not present

## 2016-07-19 DIAGNOSIS — Z7902 Long term (current) use of antithrombotics/antiplatelets: Secondary | ICD-10-CM | POA: Diagnosis not present

## 2016-07-19 DIAGNOSIS — J449 Chronic obstructive pulmonary disease, unspecified: Secondary | ICD-10-CM | POA: Diagnosis not present

## 2016-07-19 DIAGNOSIS — Z7982 Long term (current) use of aspirin: Secondary | ICD-10-CM | POA: Diagnosis not present

## 2016-07-20 DIAGNOSIS — Z9861 Coronary angioplasty status: Secondary | ICD-10-CM | POA: Diagnosis not present

## 2016-07-23 DIAGNOSIS — Z9861 Coronary angioplasty status: Secondary | ICD-10-CM | POA: Diagnosis not present

## 2016-07-26 DIAGNOSIS — H40003 Preglaucoma, unspecified, bilateral: Secondary | ICD-10-CM | POA: Diagnosis not present

## 2016-07-27 DIAGNOSIS — Z9861 Coronary angioplasty status: Secondary | ICD-10-CM | POA: Diagnosis not present

## 2016-07-29 DIAGNOSIS — Z9861 Coronary angioplasty status: Secondary | ICD-10-CM | POA: Diagnosis not present

## 2016-07-30 ENCOUNTER — Other Ambulatory Visit: Payer: Self-pay | Admitting: *Deleted

## 2016-07-30 ENCOUNTER — Encounter: Payer: Self-pay | Admitting: *Deleted

## 2016-07-30 NOTE — Patient Outreach (Signed)
[facility](THN) Care Management  07/30/2016  Shirlee LimerickWillie J Curd [birth year] [id number]  RN Health Coach telephone call to patient.  Hipaa compliance verified.Son Genevie CheshireBilly is the POA and caregiver. Patient is very hard of hearing and can't read. Per Genevie CheshireBilly the Dr said patient  has 30 % EF. Patient does not have any lower extremity swelling. Per Genevie CheshireBilly patient has gained his weight back to normal and the Dr does not feel it is fluid. [facility]. They monitor his weight and treatment. Per son they did receive the information on CHF that RN Health Coach sent. Per Genevie CheshireBilly he feels that he is doing ok with [facility] health track so no further needs required for Health Coach to continue following up.  Plan: RN will send patient and physician closure letter. RN reminded Genevie CheshireBilly son that we are available if he feels he needs our services again.  Gean MaidensFrances Johnatan Baskette BSN RN [facility] Care Management [phone]

## 2016-08-02 ENCOUNTER — Encounter: Payer: Self-pay | Admitting: *Deleted

## 2016-08-02 DIAGNOSIS — H40003 Preglaucoma, unspecified, bilateral: Secondary | ICD-10-CM | POA: Diagnosis not present

## 2016-08-03 DIAGNOSIS — Z9861 Coronary angioplasty status: Secondary | ICD-10-CM | POA: Diagnosis not present

## 2016-08-05 DIAGNOSIS — Z9861 Coronary angioplasty status: Secondary | ICD-10-CM | POA: Diagnosis not present

## 2016-08-05 DIAGNOSIS — R531 Weakness: Secondary | ICD-10-CM | POA: Diagnosis not present

## 2016-08-10 DIAGNOSIS — Z9861 Coronary angioplasty status: Secondary | ICD-10-CM | POA: Diagnosis not present

## 2016-08-12 DIAGNOSIS — Z9861 Coronary angioplasty status: Secondary | ICD-10-CM | POA: Diagnosis not present

## 2016-08-17 DIAGNOSIS — Z9861 Coronary angioplasty status: Secondary | ICD-10-CM | POA: Diagnosis not present

## 2016-08-19 DIAGNOSIS — Z9861 Coronary angioplasty status: Secondary | ICD-10-CM | POA: Diagnosis not present

## 2016-08-25 DIAGNOSIS — Z9861 Coronary angioplasty status: Secondary | ICD-10-CM | POA: Diagnosis not present

## 2016-08-26 DIAGNOSIS — Z9861 Coronary angioplasty status: Secondary | ICD-10-CM | POA: Diagnosis not present

## 2016-08-31 DIAGNOSIS — Z9861 Coronary angioplasty status: Secondary | ICD-10-CM | POA: Diagnosis not present

## 2016-09-01 DIAGNOSIS — F3341 Major depressive disorder, recurrent, in partial remission: Secondary | ICD-10-CM | POA: Diagnosis not present

## 2016-09-01 DIAGNOSIS — J449 Chronic obstructive pulmonary disease, unspecified: Secondary | ICD-10-CM | POA: Diagnosis not present

## 2016-09-01 DIAGNOSIS — I2510 Atherosclerotic heart disease of native coronary artery without angina pectoris: Secondary | ICD-10-CM | POA: Diagnosis not present

## 2016-09-01 DIAGNOSIS — I10 Essential (primary) hypertension: Secondary | ICD-10-CM | POA: Diagnosis not present

## 2016-09-01 DIAGNOSIS — K219 Gastro-esophageal reflux disease without esophagitis: Secondary | ICD-10-CM | POA: Diagnosis not present

## 2016-09-01 DIAGNOSIS — I5023 Acute on chronic systolic (congestive) heart failure: Secondary | ICD-10-CM | POA: Diagnosis not present

## 2016-09-01 DIAGNOSIS — Z23 Encounter for immunization: Secondary | ICD-10-CM | POA: Diagnosis not present

## 2016-09-02 DIAGNOSIS — Z9861 Coronary angioplasty status: Secondary | ICD-10-CM | POA: Diagnosis not present

## 2016-10-18 DIAGNOSIS — F17200 Nicotine dependence, unspecified, uncomplicated: Secondary | ICD-10-CM | POA: Diagnosis not present

## 2016-10-18 DIAGNOSIS — I255 Ischemic cardiomyopathy: Secondary | ICD-10-CM | POA: Diagnosis not present

## 2016-10-18 DIAGNOSIS — I5189 Other ill-defined heart diseases: Secondary | ICD-10-CM | POA: Diagnosis not present

## 2016-10-18 DIAGNOSIS — R0609 Other forms of dyspnea: Secondary | ICD-10-CM | POA: Diagnosis not present

## 2016-10-18 DIAGNOSIS — I2510 Atherosclerotic heart disease of native coronary artery without angina pectoris: Secondary | ICD-10-CM | POA: Diagnosis not present

## 2016-10-18 DIAGNOSIS — I5023 Acute on chronic systolic (congestive) heart failure: Secondary | ICD-10-CM | POA: Diagnosis not present

## 2016-10-18 DIAGNOSIS — Z79899 Other long term (current) drug therapy: Secondary | ICD-10-CM | POA: Diagnosis not present

## 2016-11-03 DIAGNOSIS — N183 Chronic kidney disease, stage 3 (moderate): Secondary | ICD-10-CM | POA: Diagnosis not present

## 2016-11-03 DIAGNOSIS — N2581 Secondary hyperparathyroidism of renal origin: Secondary | ICD-10-CM | POA: Diagnosis not present

## 2016-11-03 DIAGNOSIS — E872 Acidosis: Secondary | ICD-10-CM | POA: Diagnosis not present

## 2016-11-24 DIAGNOSIS — R0609 Other forms of dyspnea: Secondary | ICD-10-CM | POA: Diagnosis not present

## 2016-11-24 DIAGNOSIS — I5023 Acute on chronic systolic (congestive) heart failure: Secondary | ICD-10-CM | POA: Diagnosis not present

## 2017-01-31 DIAGNOSIS — R0609 Other forms of dyspnea: Secondary | ICD-10-CM | POA: Diagnosis not present

## 2017-01-31 DIAGNOSIS — H26492 Other secondary cataract, left eye: Secondary | ICD-10-CM | POA: Diagnosis not present

## 2017-01-31 DIAGNOSIS — I5023 Acute on chronic systolic (congestive) heart failure: Secondary | ICD-10-CM | POA: Diagnosis not present

## 2017-02-15 DIAGNOSIS — R0609 Other forms of dyspnea: Secondary | ICD-10-CM | POA: Diagnosis not present

## 2017-03-01 DIAGNOSIS — I10 Essential (primary) hypertension: Secondary | ICD-10-CM | POA: Diagnosis not present

## 2017-03-01 DIAGNOSIS — I2510 Atherosclerotic heart disease of native coronary artery without angina pectoris: Secondary | ICD-10-CM | POA: Diagnosis not present

## 2017-03-01 DIAGNOSIS — I5022 Chronic systolic (congestive) heart failure: Secondary | ICD-10-CM | POA: Diagnosis not present

## 2017-03-01 DIAGNOSIS — M129 Arthropathy, unspecified: Secondary | ICD-10-CM | POA: Diagnosis not present

## 2017-03-01 DIAGNOSIS — F3341 Major depressive disorder, recurrent, in partial remission: Secondary | ICD-10-CM | POA: Diagnosis not present

## 2017-03-01 DIAGNOSIS — K219 Gastro-esophageal reflux disease without esophagitis: Secondary | ICD-10-CM | POA: Diagnosis not present

## 2017-03-01 DIAGNOSIS — J449 Chronic obstructive pulmonary disease, unspecified: Secondary | ICD-10-CM | POA: Diagnosis not present

## 2017-03-07 DIAGNOSIS — N183 Chronic kidney disease, stage 3 (moderate): Secondary | ICD-10-CM | POA: Diagnosis not present

## 2017-03-07 DIAGNOSIS — E872 Acidosis: Secondary | ICD-10-CM | POA: Diagnosis not present

## 2017-03-07 DIAGNOSIS — N2581 Secondary hyperparathyroidism of renal origin: Secondary | ICD-10-CM | POA: Diagnosis not present

## 2017-07-08 DIAGNOSIS — N2581 Secondary hyperparathyroidism of renal origin: Secondary | ICD-10-CM | POA: Diagnosis not present

## 2017-07-08 DIAGNOSIS — N183 Chronic kidney disease, stage 3 (moderate): Secondary | ICD-10-CM | POA: Diagnosis not present

## 2017-07-08 DIAGNOSIS — E872 Acidosis: Secondary | ICD-10-CM | POA: Diagnosis not present

## 2017-08-01 DIAGNOSIS — H40003 Preglaucoma, unspecified, bilateral: Secondary | ICD-10-CM | POA: Diagnosis not present

## 2017-08-01 DIAGNOSIS — I739 Peripheral vascular disease, unspecified: Secondary | ICD-10-CM | POA: Diagnosis not present

## 2017-08-01 DIAGNOSIS — R0609 Other forms of dyspnea: Secondary | ICD-10-CM | POA: Diagnosis not present

## 2017-08-01 DIAGNOSIS — I5023 Acute on chronic systolic (congestive) heart failure: Secondary | ICD-10-CM | POA: Diagnosis not present

## 2017-08-08 DIAGNOSIS — H40003 Preglaucoma, unspecified, bilateral: Secondary | ICD-10-CM | POA: Diagnosis not present

## 2017-09-01 DIAGNOSIS — I10 Essential (primary) hypertension: Secondary | ICD-10-CM | POA: Diagnosis not present

## 2017-09-01 DIAGNOSIS — F3341 Major depressive disorder, recurrent, in partial remission: Secondary | ICD-10-CM | POA: Diagnosis not present

## 2017-09-01 DIAGNOSIS — K219 Gastro-esophageal reflux disease without esophagitis: Secondary | ICD-10-CM | POA: Diagnosis not present

## 2017-09-01 DIAGNOSIS — M129 Arthropathy, unspecified: Secondary | ICD-10-CM | POA: Diagnosis not present

## 2017-09-01 DIAGNOSIS — J449 Chronic obstructive pulmonary disease, unspecified: Secondary | ICD-10-CM | POA: Diagnosis not present

## 2017-09-01 DIAGNOSIS — I2510 Atherosclerotic heart disease of native coronary artery without angina pectoris: Secondary | ICD-10-CM | POA: Diagnosis not present

## 2017-09-01 DIAGNOSIS — I5022 Chronic systolic (congestive) heart failure: Secondary | ICD-10-CM | POA: Diagnosis not present

## 2017-10-07 DIAGNOSIS — N183 Chronic kidney disease, stage 3 (moderate): Secondary | ICD-10-CM | POA: Diagnosis not present

## 2017-10-07 DIAGNOSIS — N2581 Secondary hyperparathyroidism of renal origin: Secondary | ICD-10-CM | POA: Diagnosis not present

## 2017-10-07 DIAGNOSIS — E872 Acidosis: Secondary | ICD-10-CM | POA: Diagnosis not present

## 2017-11-14 DIAGNOSIS — I2510 Atherosclerotic heart disease of native coronary artery without angina pectoris: Secondary | ICD-10-CM | POA: Diagnosis not present

## 2017-11-14 DIAGNOSIS — Z87891 Personal history of nicotine dependence: Secondary | ICD-10-CM | POA: Diagnosis not present

## 2017-11-14 DIAGNOSIS — R0609 Other forms of dyspnea: Secondary | ICD-10-CM | POA: Diagnosis not present

## 2017-11-14 DIAGNOSIS — Z79899 Other long term (current) drug therapy: Secondary | ICD-10-CM | POA: Diagnosis not present

## 2017-11-14 DIAGNOSIS — J449 Chronic obstructive pulmonary disease, unspecified: Secondary | ICD-10-CM | POA: Diagnosis not present

## 2017-11-14 DIAGNOSIS — I739 Peripheral vascular disease, unspecified: Secondary | ICD-10-CM | POA: Diagnosis not present

## 2017-11-14 DIAGNOSIS — I110 Hypertensive heart disease with heart failure: Secondary | ICD-10-CM | POA: Diagnosis not present

## 2017-11-14 DIAGNOSIS — I5023 Acute on chronic systolic (congestive) heart failure: Secondary | ICD-10-CM | POA: Diagnosis not present

## 2018-02-07 DIAGNOSIS — H353131 Nonexudative age-related macular degeneration, bilateral, early dry stage: Secondary | ICD-10-CM | POA: Diagnosis not present

## 2018-03-01 DIAGNOSIS — D696 Thrombocytopenia, unspecified: Secondary | ICD-10-CM | POA: Diagnosis not present

## 2018-03-01 DIAGNOSIS — I5022 Chronic systolic (congestive) heart failure: Secondary | ICD-10-CM | POA: Diagnosis not present

## 2018-03-01 DIAGNOSIS — I10 Essential (primary) hypertension: Secondary | ICD-10-CM | POA: Diagnosis not present

## 2018-03-01 DIAGNOSIS — Z0000 Encounter for general adult medical examination without abnormal findings: Secondary | ICD-10-CM | POA: Diagnosis not present

## 2018-03-01 DIAGNOSIS — K219 Gastro-esophageal reflux disease without esophagitis: Secondary | ICD-10-CM | POA: Diagnosis not present

## 2018-03-01 DIAGNOSIS — M129 Arthropathy, unspecified: Secondary | ICD-10-CM | POA: Diagnosis not present

## 2018-03-01 DIAGNOSIS — F3341 Major depressive disorder, recurrent, in partial remission: Secondary | ICD-10-CM | POA: Diagnosis not present

## 2018-03-01 DIAGNOSIS — I2510 Atherosclerotic heart disease of native coronary artery without angina pectoris: Secondary | ICD-10-CM | POA: Diagnosis not present

## 2018-03-01 DIAGNOSIS — J449 Chronic obstructive pulmonary disease, unspecified: Secondary | ICD-10-CM | POA: Diagnosis not present

## 2018-03-03 DIAGNOSIS — H26491 Other secondary cataract, right eye: Secondary | ICD-10-CM | POA: Diagnosis not present

## 2018-03-30 DIAGNOSIS — J019 Acute sinusitis, unspecified: Secondary | ICD-10-CM | POA: Diagnosis not present

## 2018-03-30 DIAGNOSIS — R05 Cough: Secondary | ICD-10-CM | POA: Diagnosis not present

## 2018-06-19 DIAGNOSIS — I5023 Acute on chronic systolic (congestive) heart failure: Secondary | ICD-10-CM | POA: Diagnosis not present

## 2018-08-09 DIAGNOSIS — I10 Essential (primary) hypertension: Secondary | ICD-10-CM | POA: Diagnosis not present

## 2018-08-09 DIAGNOSIS — N183 Chronic kidney disease, stage 3 (moderate): Secondary | ICD-10-CM | POA: Diagnosis not present

## 2018-12-25 DIAGNOSIS — I5023 Acute on chronic systolic (congestive) heart failure: Secondary | ICD-10-CM | POA: Diagnosis not present

## 2018-12-25 DIAGNOSIS — R0609 Other forms of dyspnea: Secondary | ICD-10-CM | POA: Diagnosis not present

## 2018-12-28 DIAGNOSIS — M129 Arthropathy, unspecified: Secondary | ICD-10-CM | POA: Diagnosis not present

## 2018-12-28 DIAGNOSIS — D696 Thrombocytopenia, unspecified: Secondary | ICD-10-CM | POA: Diagnosis not present

## 2018-12-28 DIAGNOSIS — I10 Essential (primary) hypertension: Secondary | ICD-10-CM | POA: Diagnosis not present

## 2018-12-28 DIAGNOSIS — K219 Gastro-esophageal reflux disease without esophagitis: Secondary | ICD-10-CM | POA: Diagnosis not present

## 2018-12-28 DIAGNOSIS — J449 Chronic obstructive pulmonary disease, unspecified: Secondary | ICD-10-CM | POA: Diagnosis not present

## 2018-12-28 DIAGNOSIS — F3341 Major depressive disorder, recurrent, in partial remission: Secondary | ICD-10-CM | POA: Diagnosis not present

## 2018-12-28 DIAGNOSIS — I2510 Atherosclerotic heart disease of native coronary artery without angina pectoris: Secondary | ICD-10-CM | POA: Diagnosis not present

## 2018-12-28 DIAGNOSIS — I5022 Chronic systolic (congestive) heart failure: Secondary | ICD-10-CM | POA: Diagnosis not present

## 2018-12-28 DIAGNOSIS — R21 Rash and other nonspecific skin eruption: Secondary | ICD-10-CM | POA: Diagnosis not present

## 2019-01-03 DIAGNOSIS — I5021 Acute systolic (congestive) heart failure: Secondary | ICD-10-CM | POA: Diagnosis not present

## 2019-01-16 DIAGNOSIS — D696 Thrombocytopenia, unspecified: Secondary | ICD-10-CM | POA: Diagnosis not present

## 2019-01-16 DIAGNOSIS — I10 Essential (primary) hypertension: Secondary | ICD-10-CM | POA: Diagnosis not present

## 2019-01-29 DIAGNOSIS — E876 Hypokalemia: Secondary | ICD-10-CM | POA: Diagnosis not present

## 2019-03-01 DIAGNOSIS — N281 Cyst of kidney, acquired: Secondary | ICD-10-CM | POA: Diagnosis not present

## 2019-03-01 DIAGNOSIS — N183 Chronic kidney disease, stage 3 (moderate): Secondary | ICD-10-CM | POA: Diagnosis not present

## 2019-03-26 DIAGNOSIS — I5023 Acute on chronic systolic (congestive) heart failure: Secondary | ICD-10-CM | POA: Diagnosis not present

## 2019-04-03 DIAGNOSIS — J449 Chronic obstructive pulmonary disease, unspecified: Secondary | ICD-10-CM | POA: Diagnosis not present

## 2019-04-03 DIAGNOSIS — N281 Cyst of kidney, acquired: Secondary | ICD-10-CM | POA: Diagnosis not present

## 2019-04-03 DIAGNOSIS — R300 Dysuria: Secondary | ICD-10-CM | POA: Diagnosis not present

## 2019-04-03 DIAGNOSIS — N183 Chronic kidney disease, stage 3 (moderate): Secondary | ICD-10-CM | POA: Diagnosis not present

## 2019-06-28 DIAGNOSIS — D696 Thrombocytopenia, unspecified: Secondary | ICD-10-CM | POA: Diagnosis not present

## 2019-06-28 DIAGNOSIS — I5022 Chronic systolic (congestive) heart failure: Secondary | ICD-10-CM | POA: Diagnosis not present

## 2019-06-28 DIAGNOSIS — J449 Chronic obstructive pulmonary disease, unspecified: Secondary | ICD-10-CM | POA: Diagnosis not present

## 2019-06-28 DIAGNOSIS — Z0000 Encounter for general adult medical examination without abnormal findings: Secondary | ICD-10-CM | POA: Diagnosis not present

## 2019-06-28 DIAGNOSIS — I2510 Atherosclerotic heart disease of native coronary artery without angina pectoris: Secondary | ICD-10-CM | POA: Diagnosis not present

## 2019-06-28 DIAGNOSIS — F3341 Major depressive disorder, recurrent, in partial remission: Secondary | ICD-10-CM | POA: Diagnosis not present

## 2019-06-28 DIAGNOSIS — M129 Arthropathy, unspecified: Secondary | ICD-10-CM | POA: Diagnosis not present

## 2019-06-28 DIAGNOSIS — I10 Essential (primary) hypertension: Secondary | ICD-10-CM | POA: Diagnosis not present

## 2019-06-28 DIAGNOSIS — K219 Gastro-esophageal reflux disease without esophagitis: Secondary | ICD-10-CM | POA: Diagnosis not present

## 2019-07-02 DIAGNOSIS — D696 Thrombocytopenia, unspecified: Secondary | ICD-10-CM | POA: Diagnosis not present

## 2019-07-02 DIAGNOSIS — I10 Essential (primary) hypertension: Secondary | ICD-10-CM | POA: Diagnosis not present

## 2019-07-16 DIAGNOSIS — R7301 Impaired fasting glucose: Secondary | ICD-10-CM | POA: Diagnosis not present

## 2019-09-04 DIAGNOSIS — N183 Chronic kidney disease, stage 3 (moderate): Secondary | ICD-10-CM | POA: Diagnosis not present

## 2019-09-04 DIAGNOSIS — I10 Essential (primary) hypertension: Secondary | ICD-10-CM | POA: Diagnosis not present

## 2019-12-31 DIAGNOSIS — I10 Essential (primary) hypertension: Secondary | ICD-10-CM | POA: Diagnosis not present

## 2019-12-31 DIAGNOSIS — J449 Chronic obstructive pulmonary disease, unspecified: Secondary | ICD-10-CM | POA: Diagnosis not present

## 2019-12-31 DIAGNOSIS — R7302 Impaired glucose tolerance (oral): Secondary | ICD-10-CM | POA: Diagnosis not present

## 2019-12-31 DIAGNOSIS — K219 Gastro-esophageal reflux disease without esophagitis: Secondary | ICD-10-CM | POA: Diagnosis not present

## 2019-12-31 DIAGNOSIS — I5022 Chronic systolic (congestive) heart failure: Secondary | ICD-10-CM | POA: Diagnosis not present

## 2019-12-31 DIAGNOSIS — R413 Other amnesia: Secondary | ICD-10-CM | POA: Diagnosis not present

## 2019-12-31 DIAGNOSIS — D696 Thrombocytopenia, unspecified: Secondary | ICD-10-CM | POA: Diagnosis not present

## 2019-12-31 DIAGNOSIS — I2510 Atherosclerotic heart disease of native coronary artery without angina pectoris: Secondary | ICD-10-CM | POA: Diagnosis not present

## 2019-12-31 DIAGNOSIS — M129 Arthropathy, unspecified: Secondary | ICD-10-CM | POA: Diagnosis not present

## 2019-12-31 DIAGNOSIS — F3341 Major depressive disorder, recurrent, in partial remission: Secondary | ICD-10-CM | POA: Diagnosis not present

## 2020-01-02 DIAGNOSIS — I5022 Chronic systolic (congestive) heart failure: Secondary | ICD-10-CM | POA: Diagnosis not present

## 2020-01-02 DIAGNOSIS — D696 Thrombocytopenia, unspecified: Secondary | ICD-10-CM | POA: Diagnosis not present

## 2020-01-02 DIAGNOSIS — R7302 Impaired glucose tolerance (oral): Secondary | ICD-10-CM | POA: Diagnosis not present

## 2020-01-04 DIAGNOSIS — R413 Other amnesia: Secondary | ICD-10-CM | POA: Diagnosis not present

## 2020-01-16 DIAGNOSIS — F17210 Nicotine dependence, cigarettes, uncomplicated: Secondary | ICD-10-CM | POA: Diagnosis not present

## 2020-01-16 DIAGNOSIS — I5023 Acute on chronic systolic (congestive) heart failure: Secondary | ICD-10-CM | POA: Diagnosis not present

## 2020-01-16 DIAGNOSIS — F17220 Nicotine dependence, chewing tobacco, uncomplicated: Secondary | ICD-10-CM | POA: Diagnosis not present

## 2020-01-16 DIAGNOSIS — I739 Peripheral vascular disease, unspecified: Secondary | ICD-10-CM | POA: Diagnosis not present

## 2020-01-16 DIAGNOSIS — J449 Chronic obstructive pulmonary disease, unspecified: Secondary | ICD-10-CM | POA: Diagnosis not present

## 2020-01-16 DIAGNOSIS — M150 Primary generalized (osteo)arthritis: Secondary | ICD-10-CM | POA: Diagnosis not present

## 2020-01-16 DIAGNOSIS — I255 Ischemic cardiomyopathy: Secondary | ICD-10-CM | POA: Diagnosis not present

## 2020-01-16 DIAGNOSIS — I2510 Atherosclerotic heart disease of native coronary artery without angina pectoris: Secondary | ICD-10-CM | POA: Diagnosis not present

## 2020-01-16 DIAGNOSIS — I110 Hypertensive heart disease with heart failure: Secondary | ICD-10-CM | POA: Diagnosis not present

## 2020-01-16 DIAGNOSIS — F5101 Primary insomnia: Secondary | ICD-10-CM | POA: Diagnosis not present

## 2020-01-16 DIAGNOSIS — D649 Anemia, unspecified: Secondary | ICD-10-CM | POA: Diagnosis not present

## 2020-01-16 DIAGNOSIS — K219 Gastro-esophageal reflux disease without esophagitis: Secondary | ICD-10-CM | POA: Diagnosis not present

## 2020-01-16 DIAGNOSIS — D696 Thrombocytopenia, unspecified: Secondary | ICD-10-CM | POA: Diagnosis not present

## 2020-02-01 DIAGNOSIS — I255 Ischemic cardiomyopathy: Secondary | ICD-10-CM | POA: Diagnosis not present

## 2020-02-01 DIAGNOSIS — I739 Peripheral vascular disease, unspecified: Secondary | ICD-10-CM | POA: Diagnosis not present

## 2020-02-01 DIAGNOSIS — I110 Hypertensive heart disease with heart failure: Secondary | ICD-10-CM | POA: Diagnosis not present

## 2020-02-01 DIAGNOSIS — J449 Chronic obstructive pulmonary disease, unspecified: Secondary | ICD-10-CM | POA: Diagnosis not present

## 2020-02-01 DIAGNOSIS — K219 Gastro-esophageal reflux disease without esophagitis: Secondary | ICD-10-CM | POA: Diagnosis not present

## 2020-02-01 DIAGNOSIS — I5023 Acute on chronic systolic (congestive) heart failure: Secondary | ICD-10-CM | POA: Diagnosis not present

## 2020-02-01 DIAGNOSIS — M150 Primary generalized (osteo)arthritis: Secondary | ICD-10-CM | POA: Diagnosis not present

## 2020-02-11 DIAGNOSIS — K219 Gastro-esophageal reflux disease without esophagitis: Secondary | ICD-10-CM | POA: Diagnosis not present

## 2020-02-11 DIAGNOSIS — Z955 Presence of coronary angioplasty implant and graft: Secondary | ICD-10-CM | POA: Diagnosis not present

## 2020-02-11 DIAGNOSIS — I5023 Acute on chronic systolic (congestive) heart failure: Secondary | ICD-10-CM | POA: Diagnosis not present

## 2020-02-11 DIAGNOSIS — F17220 Nicotine dependence, chewing tobacco, uncomplicated: Secondary | ICD-10-CM | POA: Diagnosis not present

## 2020-02-11 DIAGNOSIS — F17210 Nicotine dependence, cigarettes, uncomplicated: Secondary | ICD-10-CM | POA: Diagnosis not present

## 2020-02-11 DIAGNOSIS — J449 Chronic obstructive pulmonary disease, unspecified: Secondary | ICD-10-CM | POA: Diagnosis not present

## 2020-02-11 DIAGNOSIS — I2510 Atherosclerotic heart disease of native coronary artery without angina pectoris: Secondary | ICD-10-CM | POA: Diagnosis not present

## 2020-02-11 DIAGNOSIS — D696 Thrombocytopenia, unspecified: Secondary | ICD-10-CM | POA: Diagnosis not present

## 2020-02-11 DIAGNOSIS — Z79899 Other long term (current) drug therapy: Secondary | ICD-10-CM | POA: Diagnosis not present

## 2020-02-11 DIAGNOSIS — R0600 Dyspnea, unspecified: Secondary | ICD-10-CM | POA: Diagnosis not present

## 2020-02-11 DIAGNOSIS — I255 Ischemic cardiomyopathy: Secondary | ICD-10-CM | POA: Diagnosis not present

## 2020-02-11 DIAGNOSIS — F5101 Primary insomnia: Secondary | ICD-10-CM | POA: Diagnosis not present

## 2020-02-11 DIAGNOSIS — I5022 Chronic systolic (congestive) heart failure: Secondary | ICD-10-CM | POA: Diagnosis not present

## 2020-02-11 DIAGNOSIS — M150 Primary generalized (osteo)arthritis: Secondary | ICD-10-CM | POA: Diagnosis not present

## 2020-02-11 DIAGNOSIS — D649 Anemia, unspecified: Secondary | ICD-10-CM | POA: Diagnosis not present

## 2020-02-11 DIAGNOSIS — I739 Peripheral vascular disease, unspecified: Secondary | ICD-10-CM | POA: Diagnosis not present

## 2020-02-11 DIAGNOSIS — I110 Hypertensive heart disease with heart failure: Secondary | ICD-10-CM | POA: Diagnosis not present

## 2020-02-19 DIAGNOSIS — I5023 Acute on chronic systolic (congestive) heart failure: Secondary | ICD-10-CM | POA: Diagnosis not present

## 2020-03-12 DIAGNOSIS — L819 Disorder of pigmentation, unspecified: Secondary | ICD-10-CM | POA: Diagnosis not present

## 2020-03-12 DIAGNOSIS — I10 Essential (primary) hypertension: Secondary | ICD-10-CM | POA: Diagnosis not present

## 2020-03-12 DIAGNOSIS — I5022 Chronic systolic (congestive) heart failure: Secondary | ICD-10-CM | POA: Diagnosis not present

## 2020-03-12 DIAGNOSIS — E876 Hypokalemia: Secondary | ICD-10-CM | POA: Diagnosis not present

## 2020-04-07 DIAGNOSIS — R413 Other amnesia: Secondary | ICD-10-CM | POA: Diagnosis not present

## 2020-05-26 DIAGNOSIS — I5023 Acute on chronic systolic (congestive) heart failure: Secondary | ICD-10-CM | POA: Diagnosis not present

## 2020-07-03 DIAGNOSIS — R413 Other amnesia: Secondary | ICD-10-CM | POA: Diagnosis not present

## 2020-07-03 DIAGNOSIS — I10 Essential (primary) hypertension: Secondary | ICD-10-CM | POA: Diagnosis not present

## 2020-07-03 DIAGNOSIS — Z0000 Encounter for general adult medical examination without abnormal findings: Secondary | ICD-10-CM | POA: Diagnosis not present

## 2020-07-03 DIAGNOSIS — M129 Arthropathy, unspecified: Secondary | ICD-10-CM | POA: Diagnosis not present

## 2020-07-03 DIAGNOSIS — N1832 Chronic kidney disease, stage 3b: Secondary | ICD-10-CM | POA: Diagnosis not present

## 2020-07-03 DIAGNOSIS — K219 Gastro-esophageal reflux disease without esophagitis: Secondary | ICD-10-CM | POA: Diagnosis not present

## 2020-07-03 DIAGNOSIS — I5022 Chronic systolic (congestive) heart failure: Secondary | ICD-10-CM | POA: Diagnosis not present

## 2020-07-03 DIAGNOSIS — J449 Chronic obstructive pulmonary disease, unspecified: Secondary | ICD-10-CM | POA: Diagnosis not present

## 2020-07-03 DIAGNOSIS — F3341 Major depressive disorder, recurrent, in partial remission: Secondary | ICD-10-CM | POA: Diagnosis not present

## 2020-07-03 DIAGNOSIS — R7302 Impaired glucose tolerance (oral): Secondary | ICD-10-CM | POA: Diagnosis not present

## 2020-07-03 DIAGNOSIS — I2510 Atherosclerotic heart disease of native coronary artery without angina pectoris: Secondary | ICD-10-CM | POA: Diagnosis not present

## 2020-07-03 DIAGNOSIS — D696 Thrombocytopenia, unspecified: Secondary | ICD-10-CM | POA: Diagnosis not present

## 2020-09-01 DIAGNOSIS — I10 Essential (primary) hypertension: Secondary | ICD-10-CM | POA: Diagnosis not present

## 2020-09-01 DIAGNOSIS — N1832 Chronic kidney disease, stage 3b: Secondary | ICD-10-CM | POA: Diagnosis not present

## 2020-10-07 DIAGNOSIS — R413 Other amnesia: Secondary | ICD-10-CM | POA: Diagnosis not present

## 2020-11-24 DIAGNOSIS — I5023 Acute on chronic systolic (congestive) heart failure: Secondary | ICD-10-CM | POA: Diagnosis not present

## 2020-12-18 DIAGNOSIS — J449 Chronic obstructive pulmonary disease, unspecified: Secondary | ICD-10-CM | POA: Diagnosis not present

## 2020-12-18 DIAGNOSIS — F0390 Unspecified dementia without behavioral disturbance: Secondary | ICD-10-CM | POA: Diagnosis not present

## 2020-12-18 DIAGNOSIS — F17210 Nicotine dependence, cigarettes, uncomplicated: Secondary | ICD-10-CM | POA: Diagnosis not present

## 2020-12-24 DIAGNOSIS — J449 Chronic obstructive pulmonary disease, unspecified: Secondary | ICD-10-CM | POA: Diagnosis not present

## 2020-12-24 DIAGNOSIS — F17200 Nicotine dependence, unspecified, uncomplicated: Secondary | ICD-10-CM | POA: Diagnosis not present

## 2021-01-01 DIAGNOSIS — Z09 Encounter for follow-up examination after completed treatment for conditions other than malignant neoplasm: Secondary | ICD-10-CM | POA: Diagnosis not present

## 2021-01-01 DIAGNOSIS — Z8709 Personal history of other diseases of the respiratory system: Secondary | ICD-10-CM | POA: Diagnosis not present

## 2021-02-12 DIAGNOSIS — D692 Other nonthrombocytopenic purpura: Secondary | ICD-10-CM | POA: Diagnosis not present

## 2021-02-12 DIAGNOSIS — F17210 Nicotine dependence, cigarettes, uncomplicated: Secondary | ICD-10-CM | POA: Diagnosis not present

## 2021-02-12 DIAGNOSIS — E1122 Type 2 diabetes mellitus with diabetic chronic kidney disease: Secondary | ICD-10-CM | POA: Diagnosis not present

## 2021-02-12 DIAGNOSIS — N183 Chronic kidney disease, stage 3 unspecified: Secondary | ICD-10-CM | POA: Diagnosis not present

## 2021-02-12 DIAGNOSIS — I2510 Atherosclerotic heart disease of native coronary artery without angina pectoris: Secondary | ICD-10-CM | POA: Diagnosis not present

## 2021-02-12 DIAGNOSIS — I5020 Unspecified systolic (congestive) heart failure: Secondary | ICD-10-CM | POA: Diagnosis not present

## 2021-02-12 DIAGNOSIS — F3342 Major depressive disorder, recurrent, in full remission: Secondary | ICD-10-CM | POA: Diagnosis not present

## 2021-02-12 DIAGNOSIS — E785 Hyperlipidemia, unspecified: Secondary | ICD-10-CM | POA: Diagnosis not present

## 2021-02-12 DIAGNOSIS — F0390 Unspecified dementia without behavioral disturbance: Secondary | ICD-10-CM | POA: Diagnosis not present

## 2021-02-12 DIAGNOSIS — E1151 Type 2 diabetes mellitus with diabetic peripheral angiopathy without gangrene: Secondary | ICD-10-CM | POA: Diagnosis not present

## 2021-02-12 DIAGNOSIS — E261 Secondary hyperaldosteronism: Secondary | ICD-10-CM | POA: Diagnosis not present

## 2021-02-12 DIAGNOSIS — J449 Chronic obstructive pulmonary disease, unspecified: Secondary | ICD-10-CM | POA: Diagnosis not present

## 2021-03-27 DIAGNOSIS — E1122 Type 2 diabetes mellitus with diabetic chronic kidney disease: Secondary | ICD-10-CM | POA: Diagnosis not present

## 2021-03-27 DIAGNOSIS — D692 Other nonthrombocytopenic purpura: Secondary | ICD-10-CM | POA: Diagnosis not present

## 2021-03-27 DIAGNOSIS — E11622 Type 2 diabetes mellitus with other skin ulcer: Secondary | ICD-10-CM | POA: Diagnosis not present

## 2021-03-27 DIAGNOSIS — J449 Chronic obstructive pulmonary disease, unspecified: Secondary | ICD-10-CM | POA: Diagnosis not present

## 2021-03-27 DIAGNOSIS — I130 Hypertensive heart and chronic kidney disease with heart failure and stage 1 through stage 4 chronic kidney disease, or unspecified chronic kidney disease: Secondary | ICD-10-CM | POA: Diagnosis not present

## 2021-03-27 DIAGNOSIS — E785 Hyperlipidemia, unspecified: Secondary | ICD-10-CM | POA: Diagnosis not present

## 2021-03-27 DIAGNOSIS — E1151 Type 2 diabetes mellitus with diabetic peripheral angiopathy without gangrene: Secondary | ICD-10-CM | POA: Diagnosis not present

## 2021-03-27 DIAGNOSIS — I5020 Unspecified systolic (congestive) heart failure: Secondary | ICD-10-CM | POA: Diagnosis not present

## 2021-03-27 DIAGNOSIS — E46 Unspecified protein-calorie malnutrition: Secondary | ICD-10-CM | POA: Diagnosis not present

## 2021-03-27 DIAGNOSIS — F0390 Unspecified dementia without behavioral disturbance: Secondary | ICD-10-CM | POA: Diagnosis not present

## 2021-03-27 DIAGNOSIS — I2510 Atherosclerotic heart disease of native coronary artery without angina pectoris: Secondary | ICD-10-CM | POA: Diagnosis not present

## 2021-03-27 DIAGNOSIS — E261 Secondary hyperaldosteronism: Secondary | ICD-10-CM | POA: Diagnosis not present

## 2021-03-30 DIAGNOSIS — K219 Gastro-esophageal reflux disease without esophagitis: Secondary | ICD-10-CM | POA: Diagnosis not present

## 2021-03-30 DIAGNOSIS — F0390 Unspecified dementia without behavioral disturbance: Secondary | ICD-10-CM | POA: Diagnosis not present

## 2021-03-30 DIAGNOSIS — I2510 Atherosclerotic heart disease of native coronary artery without angina pectoris: Secondary | ICD-10-CM | POA: Diagnosis not present

## 2021-03-30 DIAGNOSIS — R7302 Impaired glucose tolerance (oral): Secondary | ICD-10-CM | POA: Diagnosis not present

## 2021-03-30 DIAGNOSIS — M129 Arthropathy, unspecified: Secondary | ICD-10-CM | POA: Diagnosis not present

## 2021-03-30 DIAGNOSIS — J449 Chronic obstructive pulmonary disease, unspecified: Secondary | ICD-10-CM | POA: Diagnosis not present

## 2021-03-30 DIAGNOSIS — F3341 Major depressive disorder, recurrent, in partial remission: Secondary | ICD-10-CM | POA: Diagnosis not present

## 2021-03-30 DIAGNOSIS — I5022 Chronic systolic (congestive) heart failure: Secondary | ICD-10-CM | POA: Diagnosis not present

## 2021-03-30 DIAGNOSIS — N1832 Chronic kidney disease, stage 3b: Secondary | ICD-10-CM | POA: Diagnosis not present

## 2021-03-30 DIAGNOSIS — D696 Thrombocytopenia, unspecified: Secondary | ICD-10-CM | POA: Diagnosis not present

## 2021-03-30 DIAGNOSIS — I10 Essential (primary) hypertension: Secondary | ICD-10-CM | POA: Diagnosis not present

## 2021-03-31 DIAGNOSIS — D696 Thrombocytopenia, unspecified: Secondary | ICD-10-CM | POA: Diagnosis not present

## 2021-03-31 DIAGNOSIS — J449 Chronic obstructive pulmonary disease, unspecified: Secondary | ICD-10-CM | POA: Diagnosis not present

## 2021-03-31 DIAGNOSIS — R7302 Impaired glucose tolerance (oral): Secondary | ICD-10-CM | POA: Diagnosis not present

## 2021-03-31 DIAGNOSIS — I5022 Chronic systolic (congestive) heart failure: Secondary | ICD-10-CM | POA: Diagnosis not present

## 2021-03-31 DIAGNOSIS — F3341 Major depressive disorder, recurrent, in partial remission: Secondary | ICD-10-CM | POA: Diagnosis not present

## 2021-03-31 DIAGNOSIS — I10 Essential (primary) hypertension: Secondary | ICD-10-CM | POA: Diagnosis not present

## 2021-03-31 DIAGNOSIS — N1832 Chronic kidney disease, stage 3b: Secondary | ICD-10-CM | POA: Diagnosis not present

## 2021-03-31 DIAGNOSIS — I2510 Atherosclerotic heart disease of native coronary artery without angina pectoris: Secondary | ICD-10-CM | POA: Diagnosis not present

## 2021-03-31 DIAGNOSIS — M129 Arthropathy, unspecified: Secondary | ICD-10-CM | POA: Diagnosis not present

## 2021-03-31 DIAGNOSIS — K219 Gastro-esophageal reflux disease without esophagitis: Secondary | ICD-10-CM | POA: Diagnosis not present

## 2021-03-31 DIAGNOSIS — F0390 Unspecified dementia without behavioral disturbance: Secondary | ICD-10-CM | POA: Diagnosis not present

## 2021-04-27 DIAGNOSIS — G479 Sleep disorder, unspecified: Secondary | ICD-10-CM | POA: Diagnosis not present

## 2021-04-27 DIAGNOSIS — R413 Other amnesia: Secondary | ICD-10-CM | POA: Diagnosis not present

## 2021-04-27 DIAGNOSIS — R519 Headache, unspecified: Secondary | ICD-10-CM | POA: Diagnosis not present

## 2021-04-27 DIAGNOSIS — I959 Hypotension, unspecified: Secondary | ICD-10-CM | POA: Diagnosis not present

## 2021-04-27 DIAGNOSIS — R946 Abnormal results of thyroid function studies: Secondary | ICD-10-CM | POA: Diagnosis not present

## 2021-04-27 DIAGNOSIS — R42 Dizziness and giddiness: Secondary | ICD-10-CM | POA: Diagnosis not present

## 2021-04-27 DIAGNOSIS — D72829 Elevated white blood cell count, unspecified: Secondary | ICD-10-CM | POA: Diagnosis not present

## 2021-04-27 DIAGNOSIS — R441 Visual hallucinations: Secondary | ICD-10-CM | POA: Diagnosis not present

## 2021-04-29 ENCOUNTER — Other Ambulatory Visit: Payer: Self-pay | Admitting: Physician Assistant

## 2021-04-29 ENCOUNTER — Other Ambulatory Visit (HOSPITAL_COMMUNITY): Payer: Self-pay | Admitting: Physician Assistant

## 2021-04-29 DIAGNOSIS — R42 Dizziness and giddiness: Secondary | ICD-10-CM

## 2021-04-29 DIAGNOSIS — R441 Visual hallucinations: Secondary | ICD-10-CM

## 2021-04-29 DIAGNOSIS — R519 Headache, unspecified: Secondary | ICD-10-CM

## 2021-04-29 DIAGNOSIS — R413 Other amnesia: Secondary | ICD-10-CM

## 2021-05-01 DIAGNOSIS — E785 Hyperlipidemia, unspecified: Secondary | ICD-10-CM | POA: Diagnosis not present

## 2021-05-01 DIAGNOSIS — E261 Secondary hyperaldosteronism: Secondary | ICD-10-CM | POA: Diagnosis not present

## 2021-05-01 DIAGNOSIS — F17200 Nicotine dependence, unspecified, uncomplicated: Secondary | ICD-10-CM | POA: Diagnosis not present

## 2021-05-01 DIAGNOSIS — I25118 Atherosclerotic heart disease of native coronary artery with other forms of angina pectoris: Secondary | ICD-10-CM | POA: Diagnosis not present

## 2021-05-01 DIAGNOSIS — F331 Major depressive disorder, recurrent, moderate: Secondary | ICD-10-CM | POA: Diagnosis not present

## 2021-05-01 DIAGNOSIS — E1151 Type 2 diabetes mellitus with diabetic peripheral angiopathy without gangrene: Secondary | ICD-10-CM | POA: Diagnosis not present

## 2021-05-01 DIAGNOSIS — E1122 Type 2 diabetes mellitus with diabetic chronic kidney disease: Secondary | ICD-10-CM | POA: Diagnosis not present

## 2021-05-01 DIAGNOSIS — E860 Dehydration: Secondary | ICD-10-CM | POA: Diagnosis not present

## 2021-05-01 DIAGNOSIS — I130 Hypertensive heart and chronic kidney disease with heart failure and stage 1 through stage 4 chronic kidney disease, or unspecified chronic kidney disease: Secondary | ICD-10-CM | POA: Diagnosis not present

## 2021-05-01 DIAGNOSIS — F0390 Unspecified dementia without behavioral disturbance: Secondary | ICD-10-CM | POA: Diagnosis not present

## 2021-05-01 DIAGNOSIS — D692 Other nonthrombocytopenic purpura: Secondary | ICD-10-CM | POA: Diagnosis not present

## 2021-05-01 DIAGNOSIS — G4700 Insomnia, unspecified: Secondary | ICD-10-CM | POA: Diagnosis not present

## 2021-05-13 ENCOUNTER — Ambulatory Visit: Payer: PPO

## 2021-06-04 DIAGNOSIS — I25118 Atherosclerotic heart disease of native coronary artery with other forms of angina pectoris: Secondary | ICD-10-CM | POA: Diagnosis not present

## 2021-06-04 DIAGNOSIS — F17200 Nicotine dependence, unspecified, uncomplicated: Secondary | ICD-10-CM | POA: Diagnosis not present

## 2021-06-04 DIAGNOSIS — I5020 Unspecified systolic (congestive) heart failure: Secondary | ICD-10-CM | POA: Diagnosis not present

## 2021-06-04 DIAGNOSIS — D692 Other nonthrombocytopenic purpura: Secondary | ICD-10-CM | POA: Diagnosis not present

## 2021-06-04 DIAGNOSIS — I951 Orthostatic hypotension: Secondary | ICD-10-CM | POA: Diagnosis not present

## 2021-06-04 DIAGNOSIS — F331 Major depressive disorder, recurrent, moderate: Secondary | ICD-10-CM | POA: Diagnosis not present

## 2021-06-04 DIAGNOSIS — E785 Hyperlipidemia, unspecified: Secondary | ICD-10-CM | POA: Diagnosis not present

## 2021-06-04 DIAGNOSIS — E1122 Type 2 diabetes mellitus with diabetic chronic kidney disease: Secondary | ICD-10-CM | POA: Diagnosis not present

## 2021-06-04 DIAGNOSIS — E1151 Type 2 diabetes mellitus with diabetic peripheral angiopathy without gangrene: Secondary | ICD-10-CM | POA: Diagnosis not present

## 2021-06-04 DIAGNOSIS — F0390 Unspecified dementia without behavioral disturbance: Secondary | ICD-10-CM | POA: Diagnosis not present

## 2021-06-04 DIAGNOSIS — I130 Hypertensive heart and chronic kidney disease with heart failure and stage 1 through stage 4 chronic kidney disease, or unspecified chronic kidney disease: Secondary | ICD-10-CM | POA: Diagnosis not present

## 2021-06-04 DIAGNOSIS — E261 Secondary hyperaldosteronism: Secondary | ICD-10-CM | POA: Diagnosis not present

## 2021-07-16 DIAGNOSIS — F17200 Nicotine dependence, unspecified, uncomplicated: Secondary | ICD-10-CM | POA: Diagnosis not present

## 2021-07-16 DIAGNOSIS — R42 Dizziness and giddiness: Secondary | ICD-10-CM | POA: Diagnosis not present

## 2021-07-16 DIAGNOSIS — R638 Other symptoms and signs concerning food and fluid intake: Secondary | ICD-10-CM | POA: Diagnosis not present

## 2021-07-16 DIAGNOSIS — D72829 Elevated white blood cell count, unspecified: Secondary | ICD-10-CM | POA: Diagnosis not present

## 2021-07-16 DIAGNOSIS — I130 Hypertensive heart and chronic kidney disease with heart failure and stage 1 through stage 4 chronic kidney disease, or unspecified chronic kidney disease: Secondary | ICD-10-CM | POA: Diagnosis not present

## 2021-07-16 DIAGNOSIS — N2581 Secondary hyperparathyroidism of renal origin: Secondary | ICD-10-CM | POA: Diagnosis not present

## 2021-07-16 DIAGNOSIS — E261 Secondary hyperaldosteronism: Secondary | ICD-10-CM | POA: Diagnosis not present

## 2021-07-16 DIAGNOSIS — J9811 Atelectasis: Secondary | ICD-10-CM | POA: Diagnosis not present

## 2021-07-16 DIAGNOSIS — F0390 Unspecified dementia without behavioral disturbance: Secondary | ICD-10-CM | POA: Diagnosis not present

## 2021-07-16 DIAGNOSIS — I4891 Unspecified atrial fibrillation: Secondary | ICD-10-CM | POA: Diagnosis not present

## 2021-07-16 DIAGNOSIS — Z20822 Contact with and (suspected) exposure to covid-19: Secondary | ICD-10-CM | POA: Diagnosis not present

## 2021-07-16 DIAGNOSIS — Z79899 Other long term (current) drug therapy: Secondary | ICD-10-CM | POA: Diagnosis not present

## 2021-07-16 DIAGNOSIS — I5020 Unspecified systolic (congestive) heart failure: Secondary | ICD-10-CM | POA: Diagnosis not present

## 2021-07-16 DIAGNOSIS — D692 Other nonthrombocytopenic purpura: Secondary | ICD-10-CM | POA: Diagnosis not present

## 2021-07-16 DIAGNOSIS — I959 Hypotension, unspecified: Secondary | ICD-10-CM | POA: Diagnosis not present

## 2021-07-16 DIAGNOSIS — L89103 Pressure ulcer of unspecified part of back, stage 3: Secondary | ICD-10-CM | POA: Diagnosis not present

## 2021-07-16 DIAGNOSIS — I2510 Atherosclerotic heart disease of native coronary artery without angina pectoris: Secondary | ICD-10-CM | POA: Diagnosis not present

## 2021-07-16 DIAGNOSIS — R001 Bradycardia, unspecified: Secondary | ICD-10-CM | POA: Diagnosis not present

## 2021-07-16 DIAGNOSIS — F17210 Nicotine dependence, cigarettes, uncomplicated: Secondary | ICD-10-CM | POA: Diagnosis not present

## 2021-07-16 DIAGNOSIS — J449 Chronic obstructive pulmonary disease, unspecified: Secondary | ICD-10-CM | POA: Diagnosis not present

## 2021-07-16 DIAGNOSIS — I110 Hypertensive heart disease with heart failure: Secondary | ICD-10-CM | POA: Diagnosis not present

## 2021-07-16 DIAGNOSIS — N183 Chronic kidney disease, stage 3 unspecified: Secondary | ICD-10-CM | POA: Diagnosis not present

## 2021-07-16 DIAGNOSIS — M47814 Spondylosis without myelopathy or radiculopathy, thoracic region: Secondary | ICD-10-CM | POA: Diagnosis not present

## 2021-07-16 DIAGNOSIS — I5022 Chronic systolic (congestive) heart failure: Secondary | ICD-10-CM | POA: Diagnosis not present

## 2021-07-16 DIAGNOSIS — R197 Diarrhea, unspecified: Secondary | ICD-10-CM | POA: Diagnosis not present

## 2021-07-22 DIAGNOSIS — E860 Dehydration: Secondary | ICD-10-CM | POA: Diagnosis not present

## 2021-07-22 DIAGNOSIS — I951 Orthostatic hypotension: Secondary | ICD-10-CM | POA: Diagnosis not present

## 2021-07-22 DIAGNOSIS — R001 Bradycardia, unspecified: Secondary | ICD-10-CM | POA: Diagnosis not present

## 2021-08-13 DIAGNOSIS — D692 Other nonthrombocytopenic purpura: Secondary | ICD-10-CM | POA: Diagnosis not present

## 2021-08-13 DIAGNOSIS — Z515 Encounter for palliative care: Secondary | ICD-10-CM | POA: Diagnosis not present

## 2021-08-13 DIAGNOSIS — I5020 Unspecified systolic (congestive) heart failure: Secondary | ICD-10-CM | POA: Diagnosis not present

## 2021-08-13 DIAGNOSIS — Z7951 Long term (current) use of inhaled steroids: Secondary | ICD-10-CM | POA: Diagnosis not present

## 2021-08-13 DIAGNOSIS — J449 Chronic obstructive pulmonary disease, unspecified: Secondary | ICD-10-CM | POA: Diagnosis not present

## 2021-08-13 DIAGNOSIS — F17200 Nicotine dependence, unspecified, uncomplicated: Secondary | ICD-10-CM | POA: Diagnosis not present

## 2021-08-13 DIAGNOSIS — L89101 Pressure ulcer of unspecified part of back, stage 1: Secondary | ICD-10-CM | POA: Diagnosis not present

## 2021-08-13 DIAGNOSIS — I25118 Atherosclerotic heart disease of native coronary artery with other forms of angina pectoris: Secondary | ICD-10-CM | POA: Diagnosis not present

## 2021-08-13 DIAGNOSIS — I110 Hypertensive heart disease with heart failure: Secondary | ICD-10-CM | POA: Diagnosis not present

## 2021-08-13 DIAGNOSIS — E261 Secondary hyperaldosteronism: Secondary | ICD-10-CM | POA: Diagnosis not present

## 2021-09-07 DIAGNOSIS — I10 Essential (primary) hypertension: Secondary | ICD-10-CM | POA: Diagnosis not present

## 2021-09-07 DIAGNOSIS — D751 Secondary polycythemia: Secondary | ICD-10-CM | POA: Diagnosis not present

## 2021-09-07 DIAGNOSIS — N1831 Chronic kidney disease, stage 3a: Secondary | ICD-10-CM | POA: Diagnosis not present

## 2021-09-22 ENCOUNTER — Inpatient Hospital Stay: Payer: PPO | Attending: Oncology | Admitting: Oncology

## 2021-09-22 ENCOUNTER — Other Ambulatory Visit: Payer: Self-pay

## 2021-09-22 ENCOUNTER — Encounter: Payer: Self-pay | Admitting: Oncology

## 2021-09-22 ENCOUNTER — Encounter (INDEPENDENT_AMBULATORY_CARE_PROVIDER_SITE_OTHER): Payer: Self-pay

## 2021-09-22 ENCOUNTER — Inpatient Hospital Stay: Payer: PPO

## 2021-09-22 VITALS — BP 78/51 | HR 49 | Temp 97.5°F | Resp 18 | Wt 221.4 lb

## 2021-09-22 DIAGNOSIS — D751 Secondary polycythemia: Secondary | ICD-10-CM

## 2021-09-22 DIAGNOSIS — I9589 Other hypotension: Secondary | ICD-10-CM | POA: Insufficient documentation

## 2021-09-22 DIAGNOSIS — F17210 Nicotine dependence, cigarettes, uncomplicated: Secondary | ICD-10-CM | POA: Diagnosis not present

## 2021-09-22 DIAGNOSIS — N189 Chronic kidney disease, unspecified: Secondary | ICD-10-CM | POA: Diagnosis not present

## 2021-09-22 DIAGNOSIS — Z720 Tobacco use: Secondary | ICD-10-CM

## 2021-09-22 DIAGNOSIS — I509 Heart failure, unspecified: Secondary | ICD-10-CM | POA: Diagnosis not present

## 2021-09-22 LAB — CBC WITH DIFFERENTIAL/PLATELET
Abs Immature Granulocytes: 0.03 10*3/uL (ref 0.00–0.07)
Basophils Absolute: 0.1 10*3/uL (ref 0.0–0.1)
Basophils Relative: 1 %
Eosinophils Absolute: 0.7 10*3/uL — ABNORMAL HIGH (ref 0.0–0.5)
Eosinophils Relative: 8 %
HCT: 57.2 % — ABNORMAL HIGH (ref 39.0–52.0)
Hemoglobin: 18.6 g/dL — ABNORMAL HIGH (ref 13.0–17.0)
Immature Granulocytes: 0 %
Lymphocytes Relative: 22 %
Lymphs Abs: 2 10*3/uL (ref 0.7–4.0)
MCH: 31.6 pg (ref 26.0–34.0)
MCHC: 32.5 g/dL (ref 30.0–36.0)
MCV: 97.3 fL (ref 80.0–100.0)
Monocytes Absolute: 0.8 10*3/uL (ref 0.1–1.0)
Monocytes Relative: 8 %
Neutro Abs: 5.4 10*3/uL (ref 1.7–7.7)
Neutrophils Relative %: 61 %
Platelets: 157 10*3/uL (ref 150–400)
RBC: 5.88 MIL/uL — ABNORMAL HIGH (ref 4.22–5.81)
RDW: 13.2 % (ref 11.5–15.5)
WBC: 8.9 10*3/uL (ref 4.0–10.5)
nRBC: 0 % (ref 0.0–0.2)

## 2021-09-22 LAB — COMPREHENSIVE METABOLIC PANEL
ALT: 22 U/L (ref 0–44)
AST: 18 U/L (ref 15–41)
Albumin: 4.1 g/dL (ref 3.5–5.0)
Alkaline Phosphatase: 64 U/L (ref 38–126)
Anion gap: 8 (ref 5–15)
BUN: 20 mg/dL (ref 8–23)
CO2: 28 mmol/L (ref 22–32)
Calcium: 9.5 mg/dL (ref 8.9–10.3)
Chloride: 101 mmol/L (ref 98–111)
Creatinine, Ser: 1.49 mg/dL — ABNORMAL HIGH (ref 0.61–1.24)
GFR, Estimated: 47 mL/min — ABNORMAL LOW (ref >60)
Glucose, Bld: 114 mg/dL — ABNORMAL HIGH (ref 70–99)
Potassium: 4.1 mmol/L (ref 3.5–5.1)
Sodium: 137 mmol/L (ref 135–145)
Total Bilirubin: 1.1 mg/dL (ref 0.3–1.2)
Total Protein: 7 g/dL (ref 6.5–8.1)

## 2021-09-22 NOTE — Progress Notes (Signed)
Pt here to establish care.

## 2021-09-22 NOTE — Progress Notes (Signed)
Hematology/Oncology Consult note [facility] Telephone:([phone] [phone]   Patient Care Team: Marina Goodell, MD as PCP - General (Family Medicine)  REFERRING PROVIDER: Mosetta Pigeon, MD  CHIEF COMPLAINTS/REASON FOR VISIT:  Evaluation of polycytosis/erythrocytosis  HISTORY OF PRESENTING ILLNESS:  Antonio Larson is a 80 y.o. male who was seen in consultation at the request of Mosetta Pigeon, MD for evaluation of polycytosis/erythrocytosis Reviewed patient's recent lab work which was obtain by other physicians Labs showed elevated hemoglobin at 18, hematocrit 55.2 total white count 12.8, platelet counts 182,000. Chronic Onset, duration since May 2022. No aggravating or alleviating factors.   Patient is currently everyday smoker.  1.5 pack per day.  Patient was accompanied by his son.  Patient has dementia, emphysema CHF,Chronic hypotension, CKD.  Patient restarted smoking a few years ago after his wife passed away.Family reports that patient sleeps a lot.  During his awake time, he smokes 1 cigarette after the one. He denies lightheaded today  Review of Systems  Unable to perform ROS: Dementia   MEDICAL HISTORY:  Past Medical History:  Diagnosis Date   Anxiety    Arthritis    Asthma    Diabetes mellitus without complication (HCC)    GERD (gastroesophageal reflux disease)    Hypotension     SURGICAL HISTORY: Past Surgical History:  Procedure Laterality Date   HEMORRHOID SURGERY  2006    SOCIAL HISTORY: Social History   Socioeconomic History   Marital status: Married    Spouse name: Not on file   Number of children: Not on file   Years of education: Not on file   Highest education level: Not on file  Occupational History   Not on file  Tobacco Use   Smoking status: Every Day    Packs/day: 1.50    Years: 40.00    Pack years: 60.00    Types: Cigarettes    Last attempt to quit: 12/14/1999    Years since quitting: 21.7    Smokeless tobacco: Former  Building services engineer Use: Never used  Substance and Sexual Activity   Alcohol use: Yes    Comment: occasional   Drug use: No   Sexual activity: Not on file  Other Topics Concern   Not on file  Social History Narrative   Not on file   Social Determinants of Health   Financial Resource Strain: Not on file  Food Insecurity: Not on file  Transportation Needs: Not on file  Physical Activity: Not on file  Stress: Not on file  Social Connections: Not on file  Intimate Partner Violence: Not on file    FAMILY HISTORY: Family History  Problem Relation Age of Onset   Heart attack Father    Colon cancer Father    Heart disease Maternal Grandfather     ALLERGIES:  has No Known Allergies.  MEDICATIONS:  Current Outpatient Medications  Medication Sig Dispense Refill   aspirin EC 81 MG tablet Take by mouth.     atorvastatin (LIPITOR) 80 MG tablet Take by mouth.     clopidogrel (PLAVIX) 75 MG tablet Take 75 mg by mouth daily.     donepezil (ARICEPT) 10 MG tablet Take 10 mg by mouth at bedtime.     empagliflozin (JARDIANCE) 10 MG TABS tablet Take 1 tablet by mouth daily with breakfast.     enalapril (VASOTEC) 2.5 MG tablet Take by mouth.     ferrous sulfate 325 (65 FE) MG tablet Take by mouth.  FLUoxetine (PROZAC) 20 MG capsule Take by mouth.     levothyroxine (SYNTHROID) 50 MCG tablet Take 50 mcg by mouth every morning.     memantine (NAMENDA) 10 MG tablet Take 10 mg by mouth 2 (two) times daily.     metoprolol succinate (TOPROL-XL) 25 MG 24 hr tablet Take 12.5 mg by mouth 2 (two) times daily.     nitroGLYCERIN (NITROSTAT) 0.4 MG SL tablet Place under the tongue.     oxyCODONE (OXY IR/ROXICODONE) 5 MG immediate release tablet TAKE 1 TABLET BY MOPUTH EVERY 4 HOURS AS NEEDED FOR PAIN  0   pantoprazole (PROTONIX) 40 MG tablet Take by mouth.     pantoprazole (PROTONIX) 40 MG tablet Take by mouth.     PARoxetine (PAXIL) 10 MG tablet Take 10 mg by mouth  daily.     sacubitril-valsartan (ENTRESTO) 24-26 MG Take 1 tablet by mouth every 12 (twelve) hours.     sodium bicarbonate 650 MG tablet Take by mouth.     spironolactone (ALDACTONE) 25 MG tablet Take 37.5 mg by mouth daily.     zolpidem (AMBIEN) 5 MG tablet Take by mouth.     salmeterol (SEREVENT) 50 MCG/DOSE diskus inhaler Inhale into the lungs. (Patient not taking: Reported on 09/22/2021)     No current facility-administered medications for this visit.     PHYSICAL EXAMINATION: ECOG PERFORMANCE STATUS: 2 - Symptomatic, <50% confined to bed Vitals:   09/22/21 0927  BP: (!) 78/51  Pulse: (!) 49  Resp: 18  Temp: (!) 97.5 F (36.4 C)   Filed Weights   09/22/21 0927  Weight: 221 lb 6.4 oz (100.4 kg)    Physical Exam Constitutional:      General: He is not in acute distress.    Comments: Frail appearance  HENT:     Head: Normocephalic and atraumatic.  Eyes:     General: No scleral icterus. Cardiovascular:     Rate and Rhythm: Normal rate and regular rhythm.     Heart sounds: Normal heart sounds.  Pulmonary:     Effort: Pulmonary effort is normal. No respiratory distress.     Breath sounds: No wheezing.     Comments: Severely decreased breath sound bilaterally. Abdominal:     General: Bowel sounds are normal. There is no distension.     Palpations: Abdomen is soft.  Musculoskeletal:        General: No deformity. Normal range of motion.     Cervical back: Normal range of motion and neck supple.  Skin:    General: Skin is warm and dry.     Findings: No erythema or rash.  Neurological:     Mental Status: He is alert. Mental status is at baseline.     Cranial Nerves: No cranial nerve deficit.     Coordination: Coordination normal.    RADIOGRAPHIC STUDIES: I have personally reviewed the radiological images as listed and agreed with the findings in the report. No results found.   LABORATORY DATA:  I have reviewed the data as listed Lab Results  Component Value  Date   WBC 8.9 09/22/2021   HGB 18.6 (H) 09/22/2021   HCT 57.2 (H) 09/22/2021   MCV 97.3 09/22/2021   PLT 157 09/22/2021   Recent Labs    09/22/21 1036  NA 137  K 4.1  CL 101  CO2 28  GLUCOSE 114*  BUN 20  CREATININE 1.49*  CALCIUM 9.5  GFRNONAA 47*  PROT 7.0  ALBUMIN 4.1  AST  18  ALT 22  ALKPHOS 64  BILITOT 1.1   Iron/TIBC/Ferritin/ %Sat No results found for: IRON, TIBC, FERRITIN, IRONPCTSAT      ASSESSMENT & PLAN:  1. Erythrocytosis   2. Tobacco abuse   3. Other specified hypotension    Labs are reviewed and discussed with patient. Polycythemia (erythrocytosis) is an abnormal elevation of hemoglobin (Hgb) and/or hematocrit (Hct) in peripheral blood, and this can be caused by primary etiology, ie bone marrow mutation, or secondary etiology, ie hypoxia, smoking etc. patient extensive smoking history definitely contribute to the erythrocytosis.  Rule out other etiologies. I will obtain erythropoietin, carbo monoxide level, rule out primary etiology, JAK2 with reflex to other mutations, BCR-ABL.   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 Discussed with patient's son that phlebotomy maybe challenging due to the chronic hypertension. CBC today was available after his visit.  Hematocrit is 57.2, hemoglobin 18, recommend small volume phlebotomy 200 ml x 1 and replaced with normal saline x1 if Hct >52 Tobacco abuse, smoke cessation was discussed with patient and his son.     Orders Placed This Encounter  Procedures   CBC with Differential/Platelet    Standing Status:   Future    Number of Occurrences:   1    Standing Expiration Date:   09/22/2022   Comprehensive metabolic panel    Standing Status:   Future    Number of Occurrences:   1    Standing Expiration Date:   09/22/2022   JAK2 V617F, w Reflex to CALR/E12/MPL    Standing Status:   Future    Number of Occurrences:   1    Standing Expiration Date:   09/22/2022   BCR-ABL1 FISH    Standing Status:   Future     Number of Occurrences:   1    Standing Expiration Date:   09/22/2022   Carbon monoxide, blood (performed at ref lab)    Standing Status:   Future    Number of Occurrences:   1    Standing Expiration Date:   09/22/2022   Erythropoietin    Standing Status:   Future    Number of Occurrences:   1    Standing Expiration Date:   09/22/2022    We spent sufficient time to discuss many aspect of care, questions were answered to patient's satisfaction. The patient knows to call the clinic with any problems questions or concerns.  Cc Mosetta Pigeon, MD  Return of visit: 2 weeks Thank you for this kind referral and the opportunity to participate in the care of this patient. A copy of today's note is routed to referring provider    Rickard Patience, MD, PhD 09/22/2021

## 2021-09-23 ENCOUNTER — Telehealth: Payer: Self-pay

## 2021-09-23 DIAGNOSIS — D751 Secondary polycythemia: Secondary | ICD-10-CM

## 2021-09-23 LAB — CARBON MONOXIDE, BLOOD (PERFORMED AT REF LAB): Carbon Monoxide, Blood: 5.1 % — ABNORMAL HIGH (ref 0.0–3.6)

## 2021-09-23 LAB — ERYTHROPOIETIN: Erythropoietin: 14.9 m[IU]/mL (ref 2.6–18.5)

## 2021-09-23 NOTE — Telephone Encounter (Signed)
-----   Message from Rickard Patience, MD sent at 09/22/2021  7:45 PM EDT ----- Please let son know that his hemoglobin and hct are very high and I recommend him to get small volume phlebotomy this week or early next week. Discussed about that during visit.  Please arrange. Also please add H&H +/- phlebotomy when he comes back to review results.

## 2021-09-23 NOTE — Telephone Encounter (Signed)
Spoke to pt's son Genevie Cheshire about Dr. Bethanne Ginger plan. Please schedule and call him with appts:   Phleb this week or early next week Add lab (hh) prior to MD/ phleb on 10/25

## 2021-09-24 ENCOUNTER — Encounter: Payer: Self-pay | Admitting: Oncology

## 2021-09-24 NOTE — Telephone Encounter (Signed)
Antonio Larson, will you try to reach out to pt's son and see if he can bring pt for phleb tomorrow or early next week please.

## 2021-09-25 DIAGNOSIS — R031 Nonspecific low blood-pressure reading: Secondary | ICD-10-CM | POA: Diagnosis not present

## 2021-09-25 DIAGNOSIS — S81801D Unspecified open wound, right lower leg, subsequent encounter: Secondary | ICD-10-CM | POA: Diagnosis not present

## 2021-09-25 DIAGNOSIS — I130 Hypertensive heart and chronic kidney disease with heart failure and stage 1 through stage 4 chronic kidney disease, or unspecified chronic kidney disease: Secondary | ICD-10-CM | POA: Diagnosis not present

## 2021-09-25 DIAGNOSIS — L89152 Pressure ulcer of sacral region, stage 2: Secondary | ICD-10-CM | POA: Diagnosis not present

## 2021-09-25 DIAGNOSIS — Z515 Encounter for palliative care: Secondary | ICD-10-CM | POA: Diagnosis not present

## 2021-09-25 DIAGNOSIS — F0390 Unspecified dementia without behavioral disturbance: Secondary | ICD-10-CM | POA: Diagnosis not present

## 2021-09-25 DIAGNOSIS — I25118 Atherosclerotic heart disease of native coronary artery with other forms of angina pectoris: Secondary | ICD-10-CM | POA: Diagnosis not present

## 2021-09-25 DIAGNOSIS — E261 Secondary hyperaldosteronism: Secondary | ICD-10-CM | POA: Diagnosis not present

## 2021-09-25 DIAGNOSIS — I5020 Unspecified systolic (congestive) heart failure: Secondary | ICD-10-CM | POA: Diagnosis not present

## 2021-09-25 DIAGNOSIS — N183 Chronic kidney disease, stage 3 unspecified: Secondary | ICD-10-CM | POA: Diagnosis not present

## 2021-09-25 LAB — BCR-ABL1 FISH
Cells Analyzed: 200
Cells Counted: 200

## 2021-09-28 ENCOUNTER — Other Ambulatory Visit: Payer: Self-pay

## 2021-09-28 ENCOUNTER — Inpatient Hospital Stay: Payer: PPO

## 2021-09-28 VITALS — BP 79/55 | HR 64 | Resp 18

## 2021-09-28 DIAGNOSIS — D751 Secondary polycythemia: Secondary | ICD-10-CM | POA: Diagnosis not present

## 2021-09-28 MED ORDER — SODIUM CHLORIDE 0.9 % IV SOLN
Freq: Once | INTRAVENOUS | Status: AC
  Filled 2021-09-28: qty 250

## 2021-09-28 NOTE — Progress Notes (Signed)
Ok per MD to perform phleb with VS- per MD it is his baseline. . 200 ml phleb performed via 20 g angio and phleb bag. Replaced with 200 ml NS. Pt tolerated without incident. Waited observation. Had drink & snack. Pt discharged stable without incident.

## 2021-09-28 NOTE — Patient Instructions (Addendum)
CANCER CENTER [facility] REGIONAL MEDICAL ONCOLOGY  Discharge Instructions: Thank you for choosing [facility] to provide your oncology and hematology care.  If you have a lab appointment with the Cancer Center, please go directly to the Cancer Center and check in at the registration area.  Wear comfortable clothing and clothing appropriate for easy access to any Portacath or PICC line.   We strive to give you quality time with your provider. You may need to reschedule your appointment if you arrive late (15 or more minutes).  Arriving late affects you and other patients whose appointments are after yours.  Also, if you miss three or more appointments without notifying the office, you may be dismissed from the clinic at the provider's discretion.      For prescription refill requests, have your pharmacy contact our office and allow 72 hours for refills to be completed.    Today you received : phlebotomy/ IV hydration     To help prevent nausea and vomiting after your treatment, we encourage you to take your nausea medication as directed.  BELOW ARE SYMPTOMS THAT SHOULD BE REPORTED IMMEDIATELY: *FEVER GREATER THAN 100.4 F (38 C) OR HIGHER *CHILLS OR SWEATING *NAUSEA AND VOMITING THAT IS NOT CONTROLLED WITH YOUR NAUSEA MEDICATION *UNUSUAL SHORTNESS OF BREATH *UNUSUAL BRUISING OR BLEEDING *URINARY PROBLEMS (pain or burning when urinating, or frequent urination) *BOWEL PROBLEMS (unusual diarrhea, constipation, pain near the anus) TENDERNESS IN MOUTH AND THROAT WITH OR WITHOUT PRESENCE OF ULCERS (sore throat, sores in mouth, or a toothache) UNUSUAL RASH, SWELLING OR PAIN  UNUSUAL VAGINAL DISCHARGE OR ITCHING   Items with * indicate a potential emergency and should be followed up as soon as possible or go to the Emergency Department if any problems should occur.  Please show the CHEMOTHERAPY ALERT CARD or IMMUNOTHERAPY ALERT CARD at check-in to the Emergency Department and triage  nurse.  Should you have questions after your visit or need to cancel or reschedule your appointment, please contact CANCER CENTER [facility] REGIONAL MEDICAL ONCOLOGY  [phone] and follow the prompts.  Office hours are 8:00 a.m. to 4:30 p.m. Monday - Friday. Please note that voicemails left after 4:00 p.m. may not be returned until the following business day.  We are closed weekends and major holidays. You have access to a nurse at all times for urgent questions. Please call the main number to the clinic [phone] and follow the prompts.  For any non-urgent questions, you may also contact your provider using MyChart. We now offer e-Visits for anyone 32 and older to request care online for non-urgent symptoms. For details visit [email or web address].   Also download the MyChart app! Go to the app store, search "MyChart", open the app, select [facility], and log in with your MyChart username and password.  Due to Covid, a mask is required upon entering the hospital/clinic. If you do not have a mask, one will be given to you upon arrival. For doctor visits, patients may have 1 support person aged 10 or older with them. For treatment visits, patients cannot have anyone with them due to current Covid guidelines and our immunocompromised population.     Therapeutic Phlebotomy Discharge Instructions  - Increase your fluid intake over the next 4 hours  - No smoking for 30 minutes  - Avoid using the affected arm (the one you had the blood drawn from) for heavy lifting or other activities.  - You may resume all normal activities after 30 minutes.  You are  to notify the office if you experience:   - Persistent dizziness and/or lightheadedness -Uncontrolled or excessive bleeding at the site.

## 2021-09-29 LAB — CALR + JAK2 E12-15 + MPL (REFLEXED)

## 2021-09-29 LAB — JAK2 V617F, W REFLEX TO CALR/E12/MPL

## 2021-10-02 DIAGNOSIS — R413 Other amnesia: Secondary | ICD-10-CM | POA: Diagnosis not present

## 2021-10-02 DIAGNOSIS — E039 Hypothyroidism, unspecified: Secondary | ICD-10-CM | POA: Diagnosis not present

## 2021-10-02 DIAGNOSIS — R7302 Impaired glucose tolerance (oral): Secondary | ICD-10-CM | POA: Diagnosis not present

## 2021-10-02 DIAGNOSIS — M129 Arthropathy, unspecified: Secondary | ICD-10-CM | POA: Diagnosis not present

## 2021-10-02 DIAGNOSIS — Z23 Encounter for immunization: Secondary | ICD-10-CM | POA: Diagnosis not present

## 2021-10-02 DIAGNOSIS — F3341 Major depressive disorder, recurrent, in partial remission: Secondary | ICD-10-CM | POA: Diagnosis not present

## 2021-10-02 DIAGNOSIS — Z0000 Encounter for general adult medical examination without abnormal findings: Secondary | ICD-10-CM | POA: Diagnosis not present

## 2021-10-02 DIAGNOSIS — K219 Gastro-esophageal reflux disease without esophagitis: Secondary | ICD-10-CM | POA: Diagnosis not present

## 2021-10-02 DIAGNOSIS — D696 Thrombocytopenia, unspecified: Secondary | ICD-10-CM | POA: Diagnosis not present

## 2021-10-02 DIAGNOSIS — I10 Essential (primary) hypertension: Secondary | ICD-10-CM | POA: Diagnosis not present

## 2021-10-02 DIAGNOSIS — J449 Chronic obstructive pulmonary disease, unspecified: Secondary | ICD-10-CM | POA: Diagnosis not present

## 2021-10-02 DIAGNOSIS — N1832 Chronic kidney disease, stage 3b: Secondary | ICD-10-CM | POA: Diagnosis not present

## 2021-10-02 DIAGNOSIS — I2510 Atherosclerotic heart disease of native coronary artery without angina pectoris: Secondary | ICD-10-CM | POA: Diagnosis not present

## 2021-10-02 DIAGNOSIS — I5022 Chronic systolic (congestive) heart failure: Secondary | ICD-10-CM | POA: Diagnosis not present

## 2021-10-06 ENCOUNTER — Other Ambulatory Visit: Payer: Self-pay

## 2021-10-06 ENCOUNTER — Inpatient Hospital Stay (HOSPITAL_BASED_OUTPATIENT_CLINIC_OR_DEPARTMENT_OTHER): Payer: PPO | Admitting: Oncology

## 2021-10-06 ENCOUNTER — Inpatient Hospital Stay: Payer: PPO

## 2021-10-06 ENCOUNTER — Encounter: Payer: Self-pay | Admitting: Oncology

## 2021-10-06 VITALS — BP 84/63 | HR 87 | Resp 18

## 2021-10-06 VITALS — BP 76/52 | HR 35 | Temp 96.2°F | Wt 220.5 lb

## 2021-10-06 DIAGNOSIS — D751 Secondary polycythemia: Secondary | ICD-10-CM

## 2021-10-06 DIAGNOSIS — Z720 Tobacco use: Secondary | ICD-10-CM

## 2021-10-06 LAB — HEMOGLOBIN AND HEMATOCRIT, BLOOD
HCT: 55.9 % — ABNORMAL HIGH (ref 39.0–52.0)
Hemoglobin: 18 g/dL — ABNORMAL HIGH (ref 13.0–17.0)

## 2021-10-06 MED ORDER — SODIUM CHLORIDE 0.9 % IV SOLN
Freq: Once | INTRAVENOUS | Status: AC
  Filled 2021-10-06: qty 250

## 2021-10-06 NOTE — Progress Notes (Signed)
MD VO ok to proceed with phlebotomy today. Patient tolerated well. 250 cc removed as ordered/ replaced with 250 cc IVF.  HCT 55.9 today.  BP improved to 84/63  P 87 from 76/52  P 35. No concerns voiced. Patient discharged with son, stable.

## 2021-10-06 NOTE — Progress Notes (Signed)
Hematology/Oncology progress note [facility] Telephone:([phone] [phone]   Patient Care Team: Marina Goodell, MD as PCP - General (Family Medicine)  REFERRING PROVIDER: Marina Goodell, MD  CHIEF COMPLAINTS/REASON FOR VISIT:  Secondary erythrocytosis  HISTORY OF PRESENTING ILLNESS:  Reviewed patient's recent lab work which was obtain by other physicians Labs showed elevated hemoglobin at 18, hematocrit 55.2 total white count 12.8, platelet counts 182,000. Chronic Onset, duration since May 2022. No aggravating or alleviating factors.   Patient is currently everyday smoker.  1.5 pack per day.  Patient was accompanied by his son.  Patient has dementia, emphysema CHF,Chronic hypotension, CKD.  Patient restarted smoking a few years ago after his wife passed away.Family reports that patient sleeps a lot.  During his awake time, he smokes 1 cigarette after the one. He denies lightheaded today   INTERVAL HISTORY Antonio Larson is a 80 y.o. male who has above history reviewed by me today presents for follow up visit for management of secondary erythrocytosis Patient was accompanied by his son.  He is a poor historian.  During the interval patient underwent phlebotomy 200 cc x 1 with 200 cc of IV fluid normal saline and he tolerated well.  No lightheadedness or dizzy afterwards.  He has also had blood work done.  Review of Systems  Unable to perform ROS: Dementia   MEDICAL HISTORY:  Past Medical History:  Diagnosis Date   Anxiety    Arthritis    Asthma    CHF (congestive heart failure) (HCC)    Diabetes mellitus without complication (HCC)    Emphysema lung (HCC)    GERD (gastroesophageal reflux disease)    Hypotension     SURGICAL HISTORY: Past Surgical History:  Procedure Laterality Date   HEMORRHOID SURGERY  2006    SOCIAL HISTORY: Social History   Socioeconomic History   Marital status: Married    Spouse name: Not on file    Number of children: Not on file   Years of education: Not on file   Highest education level: Not on file  Occupational History   Not on file  Tobacco Use   Smoking status: Every Day    Packs/day: 1.50    Years: 40.00    Pack years: 60.00    Types: Cigarettes    Last attempt to quit: 12/14/1999    Years since quitting: 21.8   Smokeless tobacco: Former  Building services engineer Use: Never used  Substance and Sexual Activity   Alcohol use: Yes    Comment: occasional   Drug use: No   Sexual activity: Not on file  Other Topics Concern   Not on file  Social History Narrative   Not on file   Social Determinants of Health   Financial Resource Strain: Not on file  Food Insecurity: Not on file  Transportation Needs: Not on file  Physical Activity: Not on file  Stress: Not on file  Social Connections: Not on file  Intimate Partner Violence: Not on file    FAMILY HISTORY: Family History  Problem Relation Age of Onset   Heart attack Father    Colon cancer Father    Heart disease Maternal Grandfather     ALLERGIES:  has No Known Allergies.  MEDICATIONS:  Current Outpatient Medications  Medication Sig Dispense Refill   aspirin EC 81 MG tablet Take by mouth.     atorvastatin (LIPITOR) 80 MG tablet Take by mouth.     clopidogrel (PLAVIX)  75 MG tablet Take 75 mg by mouth daily.     donepezil (ARICEPT) 10 MG tablet Take 10 mg by mouth at bedtime.     empagliflozin (JARDIANCE) 10 MG TABS tablet Take 1 tablet by mouth daily with breakfast.     enalapril (VASOTEC) 2.5 MG tablet Take by mouth.     ferrous sulfate 325 (65 FE) MG tablet Take by mouth.     FLUoxetine (PROZAC) 20 MG capsule Take by mouth.     levothyroxine (SYNTHROID) 50 MCG tablet Take 50 mcg by mouth every morning.     memantine (NAMENDA) 10 MG tablet Take 10 mg by mouth 2 (two) times daily.     metoprolol succinate (TOPROL-XL) 25 MG 24 hr tablet Take 12.5 mg by mouth 2 (two) times daily.     nitroGLYCERIN (NITROSTAT)  0.4 MG SL tablet Place under the tongue.     oxyCODONE (OXY IR/ROXICODONE) 5 MG immediate release tablet TAKE 1 TABLET BY MOPUTH EVERY 4 HOURS AS NEEDED FOR PAIN  0   pantoprazole (PROTONIX) 40 MG tablet Take by mouth.     pantoprazole (PROTONIX) 40 MG tablet Take by mouth.     PARoxetine (PAXIL) 10 MG tablet Take 10 mg by mouth daily.     sacubitril-valsartan (ENTRESTO) 24-26 MG Take 1 tablet by mouth every 12 (twelve) hours.     salmeterol (SEREVENT) 50 MCG/DOSE diskus inhaler Inhale into the lungs.     sodium bicarbonate 650 MG tablet Take by mouth.     spironolactone (ALDACTONE) 25 MG tablet Take 37.5 mg by mouth daily.     zolpidem (AMBIEN) 5 MG tablet Take by mouth.     No current facility-administered medications for this visit.     PHYSICAL EXAMINATION: ECOG PERFORMANCE STATUS: 2 - Symptomatic, <50% confined to bed Vitals:   10/06/21 1010  BP: (!) 76/52  Pulse: (!) 35  Temp: (!) 96.2 F (35.7 C)  SpO2: 95%   Filed Weights   10/06/21 1010  Weight: 220 lb 8 oz (100 kg)    Physical Exam Constitutional:      General: He is not in acute distress.    Comments: Frail appearance  HENT:     Head: Normocephalic and atraumatic.  Eyes:     General: No scleral icterus. Cardiovascular:     Rate and Rhythm: Normal rate and regular rhythm.     Heart sounds: Normal heart sounds.  Pulmonary:     Effort: Pulmonary effort is normal. No respiratory distress.     Breath sounds: No wheezing.     Comments: Severely decreased breath sound bilaterally. Abdominal:     General: Bowel sounds are normal. There is no distension.     Palpations: Abdomen is soft.  Musculoskeletal:        General: No deformity. Normal range of motion.     Cervical back: Normal range of motion and neck supple.  Skin:    General: Skin is warm and dry.     Findings: No erythema or rash.  Neurological:     Mental Status: He is alert. Mental status is at baseline.     Coordination: Coordination normal.     RADIOGRAPHIC STUDIES: I have personally reviewed the radiological images as listed and agreed with the findings in the report. No results found.   LABORATORY DATA:  I have reviewed the data as listed Lab Results  Component Value Date   WBC 8.9 09/22/2021   HGB 18.0 (H) 10/06/2021   HCT  55.9 (H) 10/06/2021   MCV 97.3 09/22/2021   PLT 157 09/22/2021   Recent Labs    09/22/21 1036  NA 137  K 4.1  CL 101  CO2 28  GLUCOSE 114*  BUN 20  CREATININE 1.49*  CALCIUM 9.5  GFRNONAA 47*  PROT 7.0  ALBUMIN 4.1  AST 18  ALT 22  ALKPHOS 64  BILITOT 1.1    Iron/TIBC/Ferritin/ %Sat No results found for: IRON, TIBC, FERRITIN, IRONPCTSAT      ASSESSMENT & PLAN:  1. Secondary erythrocytosis   2. Tobacco abuse    #Secondary erythrocytosis. Labs reviewed and discussed with patient and his son. JAK2 V617F mutation negative, with reflex to other mutations CALR, MPL, JAK 2 Ex 12-15 mutations negative. Negative BCR ABL 1 FISH Increase carbon monoxide level.  Consistent with secondary erythrocytosis, due to smoking. Smoke cessation was discussed with both patient and his son.  Patient is not motivated.  Recommend phlebotomy. He has chronic hypotension due to chronic heart failure.  Asymptomatic. Plan small volume phlebotomy 250 ml today and weekly x 3 if hematocrit >52, replaced NS after phlebotomy Patient and son agreed with the plan.   Orders Placed This Encounter  Procedures   Hemoglobin and Hematocrit, Blood    Standing Status:   Standing    Number of Occurrences:   3    Standing Expiration Date:   10/06/2022   CBC with Differential/Platelet    Standing Status:   Future    Standing Expiration Date:   10/06/2022    We spent sufficient time to discuss many aspect of care, questions were answered to patient's satisfaction. The patient knows to call the clinic with any problems questions or concerns.  Cc Marina Goodell, MD  Return of visit: 5 weeks Thank  you for this kind referral and the opportunity to participate in the care of this patient. A copy of today's note is routed to referring provider    Rickard Patience, MD, PhD 10/06/2021

## 2021-10-06 NOTE — Patient Instructions (Signed)

## 2021-10-08 DIAGNOSIS — G479 Sleep disorder, unspecified: Secondary | ICD-10-CM | POA: Diagnosis not present

## 2021-10-08 DIAGNOSIS — R441 Visual hallucinations: Secondary | ICD-10-CM | POA: Diagnosis not present

## 2021-10-08 DIAGNOSIS — R413 Other amnesia: Secondary | ICD-10-CM | POA: Diagnosis not present

## 2021-10-13 ENCOUNTER — Inpatient Hospital Stay: Payer: PPO

## 2021-10-13 ENCOUNTER — Inpatient Hospital Stay: Payer: PPO | Attending: Oncology

## 2021-10-13 ENCOUNTER — Other Ambulatory Visit: Payer: Self-pay

## 2021-10-13 VITALS — BP 80/52 | HR 58 | Temp 96.0°F | Resp 18

## 2021-10-13 DIAGNOSIS — D751 Secondary polycythemia: Secondary | ICD-10-CM

## 2021-10-13 LAB — HEMOGLOBIN AND HEMATOCRIT, BLOOD
HCT: 53.1 % — ABNORMAL HIGH (ref 39.0–52.0)
Hemoglobin: 17.4 g/dL — ABNORMAL HIGH (ref 13.0–17.0)

## 2021-10-13 MED ORDER — SODIUM CHLORIDE 0.9 % IV SOLN
Freq: Once | INTRAVENOUS | Status: AC
  Filled 2021-10-13: qty 250

## 2021-10-13 NOTE — Progress Notes (Signed)
250 ml blood removed per phlebotomy orders. Replaced with 250 ml NS. Tolerated procedure well. Pt drank some sprite. Discharged to home . Accompanied by son.

## 2021-10-13 NOTE — Patient Instructions (Signed)

## 2021-10-20 ENCOUNTER — Inpatient Hospital Stay: Payer: PPO

## 2021-10-20 ENCOUNTER — Other Ambulatory Visit: Payer: Self-pay

## 2021-10-20 VITALS — BP 80/50 | HR 52 | Temp 96.0°F | Resp 18

## 2021-10-20 DIAGNOSIS — D751 Secondary polycythemia: Secondary | ICD-10-CM

## 2021-10-20 LAB — HEMOGLOBIN AND HEMATOCRIT, BLOOD
HCT: 52.8 % — ABNORMAL HIGH (ref 39.0–52.0)
Hemoglobin: 17.5 g/dL — ABNORMAL HIGH (ref 13.0–17.0)

## 2021-10-20 MED ORDER — SODIUM CHLORIDE 0.9 % IV SOLN
Freq: Once | INTRAVENOUS | Status: AC
  Filled 2021-10-20: qty 250

## 2021-10-20 NOTE — Patient Instructions (Signed)

## 2021-10-20 NOTE — Progress Notes (Signed)
Removed 250 ml of blood per phlebotomy parameters. HCT 52.8 today. Replaced IVF's with 250 ml NS. Pt alert. Joking with me. Drank a sprite. Discharged via wheelchair. Accompanied by his son.

## 2021-10-27 ENCOUNTER — Inpatient Hospital Stay: Payer: PPO

## 2021-10-27 ENCOUNTER — Other Ambulatory Visit: Payer: Self-pay

## 2021-10-27 DIAGNOSIS — D751 Secondary polycythemia: Secondary | ICD-10-CM

## 2021-10-27 LAB — HEMOGLOBIN AND HEMATOCRIT, BLOOD
HCT: 50.8 % (ref 39.0–52.0)
Hemoglobin: 16.8 g/dL (ref 13.0–17.0)

## 2021-10-27 NOTE — Progress Notes (Signed)
HCT under 52 today, no phlebotomy required. Informed patient and his son.

## 2021-10-29 DIAGNOSIS — E785 Hyperlipidemia, unspecified: Secondary | ICD-10-CM | POA: Diagnosis not present

## 2021-10-29 DIAGNOSIS — E261 Secondary hyperaldosteronism: Secondary | ICD-10-CM | POA: Diagnosis not present

## 2021-10-29 DIAGNOSIS — Z7952 Long term (current) use of systemic steroids: Secondary | ICD-10-CM | POA: Diagnosis not present

## 2021-10-29 DIAGNOSIS — I5020 Unspecified systolic (congestive) heart failure: Secondary | ICD-10-CM | POA: Diagnosis not present

## 2021-10-29 DIAGNOSIS — F0390 Unspecified dementia without behavioral disturbance: Secondary | ICD-10-CM | POA: Diagnosis not present

## 2021-10-29 DIAGNOSIS — Z515 Encounter for palliative care: Secondary | ICD-10-CM | POA: Diagnosis not present

## 2021-10-29 DIAGNOSIS — J441 Chronic obstructive pulmonary disease with (acute) exacerbation: Secondary | ICD-10-CM | POA: Diagnosis not present

## 2021-10-29 DIAGNOSIS — I739 Peripheral vascular disease, unspecified: Secondary | ICD-10-CM | POA: Diagnosis not present

## 2021-11-12 ENCOUNTER — Encounter: Payer: Self-pay | Admitting: Oncology

## 2021-11-12 ENCOUNTER — Inpatient Hospital Stay: Payer: PPO | Attending: Oncology

## 2021-11-12 ENCOUNTER — Inpatient Hospital Stay (HOSPITAL_BASED_OUTPATIENT_CLINIC_OR_DEPARTMENT_OTHER): Payer: PPO | Admitting: Oncology

## 2021-11-12 ENCOUNTER — Other Ambulatory Visit: Payer: Self-pay

## 2021-11-12 VITALS — BP 110/54 | HR 64 | Temp 98.4°F | Resp 18 | Wt 216.8 lb

## 2021-11-12 DIAGNOSIS — N189 Chronic kidney disease, unspecified: Secondary | ICD-10-CM | POA: Diagnosis not present

## 2021-11-12 DIAGNOSIS — F0390 Unspecified dementia without behavioral disturbance: Secondary | ICD-10-CM | POA: Diagnosis not present

## 2021-11-12 DIAGNOSIS — I509 Heart failure, unspecified: Secondary | ICD-10-CM | POA: Diagnosis not present

## 2021-11-12 DIAGNOSIS — Z720 Tobacco use: Secondary | ICD-10-CM | POA: Diagnosis not present

## 2021-11-12 DIAGNOSIS — I9589 Other hypotension: Secondary | ICD-10-CM | POA: Insufficient documentation

## 2021-11-12 DIAGNOSIS — D751 Secondary polycythemia: Secondary | ICD-10-CM | POA: Diagnosis not present

## 2021-11-12 DIAGNOSIS — F17210 Nicotine dependence, cigarettes, uncomplicated: Secondary | ICD-10-CM | POA: Diagnosis not present

## 2021-11-12 DIAGNOSIS — J439 Emphysema, unspecified: Secondary | ICD-10-CM | POA: Insufficient documentation

## 2021-11-12 LAB — CBC WITH DIFFERENTIAL/PLATELET
Abs Immature Granulocytes: 0.04 10*3/uL (ref 0.00–0.07)
Basophils Absolute: 0 10*3/uL (ref 0.0–0.1)
Basophils Relative: 0 %
Eosinophils Absolute: 0.1 10*3/uL (ref 0.0–0.5)
Eosinophils Relative: 1 %
HCT: 53.3 % — ABNORMAL HIGH (ref 39.0–52.0)
Hemoglobin: 17.5 g/dL — ABNORMAL HIGH (ref 13.0–17.0)
Immature Granulocytes: 0 %
Lymphocytes Relative: 24 %
Lymphs Abs: 2.3 10*3/uL (ref 0.7–4.0)
MCH: 32.2 pg (ref 26.0–34.0)
MCHC: 32.8 g/dL (ref 30.0–36.0)
MCV: 98.2 fL (ref 80.0–100.0)
Monocytes Absolute: 1 10*3/uL (ref 0.1–1.0)
Monocytes Relative: 11 %
Neutro Abs: 6.2 10*3/uL (ref 1.7–7.7)
Neutrophils Relative %: 64 %
Platelets: 152 10*3/uL (ref 150–400)
RBC: 5.43 MIL/uL (ref 4.22–5.81)
RDW: 13.2 % (ref 11.5–15.5)
WBC: 9.7 10*3/uL (ref 4.0–10.5)
nRBC: 0 % (ref 0.0–0.2)

## 2021-11-12 NOTE — Progress Notes (Signed)
Pt here for follow up. No new concerns voiced.   

## 2021-11-12 NOTE — Progress Notes (Signed)
Per MD no phlebotomy required today.

## 2021-11-12 NOTE — Progress Notes (Signed)
Hematology/Oncology progress note [phone] [phone]   Patient Care Team: Marina Goodell, MD as PCP - General (Family Medicine)  REFERRING PROVIDER: Marina Goodell, MD  CHIEF COMPLAINTS/REASON FOR VISIT:  Secondary erythrocytosis  HISTORY OF PRESENTING ILLNESS:  Reviewed patient's recent lab work which was obtain by other physicians Labs showed elevated hemoglobin at 18, hematocrit 55.2 total white count 12.8, platelet counts 182,000. Chronic Onset, duration since May 2022. No aggravating or alleviating factors.   Patient is currently everyday smoker.  1.5 pack per day.  Patient was accompanied by his son.  Patient has dementia, emphysema CHF,Chronic hypotension, CKD.  Patient restarted smoking a few years ago after his wife passed away.Family reports that patient sleeps a lot.  During his awake time, he smokes 1 cigarette after the one. He denies lightheaded today   INTERVAL HISTORY Antonio Larson is a 80 y.o. male who has above history reviewed by me today presents for follow up visit for management of secondary erythrocytosis Patient was accompanied by his son.  He is a poor historian. Patient has had low volume phlebotomy sessions.  Per patient's son, patient feels very tired after phlebotomy. No other new complaints. Review of Systems  Unable to perform ROS: Dementia   MEDICAL HISTORY:  Past Medical History:  Diagnosis Date   Anxiety    Arthritis    Asthma    CHF (congestive heart failure) (HCC)    Diabetes mellitus without complication (HCC)    Emphysema lung (HCC)    GERD (gastroesophageal reflux disease)    Hypotension     SURGICAL HISTORY: Past Surgical History:  Procedure Laterality Date   HEMORRHOID SURGERY  2006    SOCIAL HISTORY: Social History   Socioeconomic History   Marital status: Married    Spouse name: Not on file   Number of children: Not on file   Years of education: Not on file   Highest education level:  Not on file  Occupational History   Not on file  Tobacco Use   Smoking status: Every Day    Packs/day: 1.50    Years: 40.00    Pack years: 60.00    Types: Cigarettes    Last attempt to quit: 12/14/1999    Years since quitting: 21.9   Smokeless tobacco: Former  Building services engineer Use: Never used  Substance and Sexual Activity   Alcohol use: Yes    Comment: occasional   Drug use: No   Sexual activity: Not on file  Other Topics Concern   Not on file  Social History Narrative   Not on file   Social Determinants of Health   Financial Resource Strain: Not on file  Food Insecurity: Not on file  Transportation Needs: Not on file  Physical Activity: Not on file  Stress: Not on file  Social Connections: Not on file  Intimate Partner Violence: Not on file    FAMILY HISTORY: Family History  Problem Relation Age of Onset   Heart attack Father    Colon cancer Father    Heart disease Maternal Grandfather     ALLERGIES:  has No Known Allergies.  MEDICATIONS:  Current Outpatient Medications  Medication Sig Dispense Refill   aspirin EC 81 MG tablet Take by mouth.     atorvastatin (LIPITOR) 80 MG tablet Take by mouth.     clopidogrel (PLAVIX) 75 MG tablet Take 75 mg by mouth daily.     donepezil (ARICEPT) 10 MG tablet Take 10 mg  by mouth at bedtime.     empagliflozin (JARDIANCE) 10 MG TABS tablet Take 1 tablet by mouth daily with breakfast.     enalapril (VASOTEC) 2.5 MG tablet Take by mouth.     FLUoxetine (PROZAC) 20 MG capsule Take by mouth.     levothyroxine (SYNTHROID) 50 MCG tablet Take 50 mcg by mouth every morning.     memantine (NAMENDA) 10 MG tablet Take 10 mg by mouth 2 (two) times daily.     metoprolol succinate (TOPROL-XL) 25 MG 24 hr tablet Take 12.5 mg by mouth 2 (two) times daily.     pantoprazole (PROTONIX) 40 MG tablet Take by mouth.     PARoxetine (PAXIL) 10 MG tablet Take 10 mg by mouth daily.     sacubitril-valsartan (ENTRESTO) 24-26 MG Take 1 tablet by  mouth every 12 (twelve) hours.     sodium bicarbonate 650 MG tablet Take by mouth.     spironolactone (ALDACTONE) 25 MG tablet Take 37.5 mg by mouth daily.     zolpidem (AMBIEN) 5 MG tablet Take by mouth.     ferrous sulfate 325 (65 FE) MG tablet Take by mouth. (Patient not taking: Reported on 11/12/2021)     nitroGLYCERIN (NITROSTAT) 0.4 MG SL tablet Place under the tongue. (Patient not taking: Reported on 11/12/2021)     oxyCODONE (OXY IR/ROXICODONE) 5 MG immediate release tablet TAKE 1 TABLET BY MOPUTH EVERY 4 HOURS AS NEEDED FOR PAIN (Patient not taking: Reported on 11/12/2021)  0   salmeterol (SEREVENT) 50 MCG/DOSE diskus inhaler Inhale into the lungs. (Patient not taking: Reported on 11/12/2021)     No current facility-administered medications for this visit.     PHYSICAL EXAMINATION: ECOG PERFORMANCE STATUS: 2 - Symptomatic, <50% confined to bed Vitals:   11/12/21 1026  BP: (!) 110/54  Pulse: 64  Resp: 18  Temp: 98.4 F (36.9 C)   Filed Weights   11/12/21 1026  Weight: 216 lb 12.8 oz (98.3 kg)    Physical Exam Constitutional:      General: He is not in acute distress.    Comments: Frail appearance  HENT:     Head: Normocephalic and atraumatic.  Eyes:     General: No scleral icterus. Cardiovascular:     Rate and Rhythm: Normal rate and regular rhythm.     Heart sounds: Normal heart sounds.  Pulmonary:     Effort: Pulmonary effort is normal. No respiratory distress.     Breath sounds: No wheezing.     Comments: Severely decreased breath sound bilaterally. Abdominal:     General: Bowel sounds are normal. There is no distension.     Palpations: Abdomen is soft.  Musculoskeletal:        General: No deformity. Normal range of motion.     Cervical back: Normal range of motion and neck supple.  Skin:    General: Skin is warm and dry.     Findings: No erythema or rash.  Neurological:     Mental Status: He is alert. Mental status is at baseline.     Coordination:  Coordination normal.    RADIOGRAPHIC STUDIES: I have personally reviewed the radiological images as listed and agreed with the findings in the report. No results found.   LABORATORY DATA:  I have reviewed the data as listed Lab Results  Component Value Date   WBC 9.7 11/12/2021   HGB 17.5 (H) 11/12/2021   HCT 53.3 (H) 11/12/2021   MCV 98.2 11/12/2021   PLT  152 11/12/2021   Recent Labs    09/22/21 1036  NA 137  K 4.1  CL 101  CO2 28  GLUCOSE 114*  BUN 20  CREATININE 1.49*  CALCIUM 9.5  GFRNONAA 47*  PROT 7.0  ALBUMIN 4.1  AST 18  ALT 22  ALKPHOS 64  BILITOT 1.1    Iron/TIBC/Ferritin/ %Sat No results found for: IRON, TIBC, FERRITIN, IRONPCTSAT   JAK2 V617F mutation negative, with reflex to other mutations CALR, MPL, JAK 2 Ex 12-15 mutations negative. Negative BCR ABL 1 FISH, Increase carbon monoxide level.    ASSESSMENT & PLAN:  1. Secondary erythrocytosis   2. Tobacco abuse    #Secondary erythrocytosis. Labs reviewed and discussed with patient and his son. Hct has improved after phlebotomy and then worsened.  He did not tolerate to well.  Given his multiple medical problems, I will increase his phlebotomy threhold to be 246ml phlebotomy if Hct >=55, and replace 250 ml of IVF with NS.  Monthly H&H + phlebotomy, follow up in 3 months.  Tobacco abuse,  Recommend smoke cessation.   patient and son agreed with the plan.   Orders Placed This Encounter  Procedures   Hemoglobin and hematocrit, blood    Standing Status:   Standing    Number of Occurrences:   6    Standing Expiration Date:   11/12/2022    We spent sufficient time to discuss many aspect of care, questions were answered to patient's satisfaction. The patient knows to call the clinic with any problems questions or concerns.  Cc Sofie Hartigan, MD  Return of visit: Monthly H&H + phlebotomy, follow up in 3 months. Thank you for this kind referral and the opportunity to participate in the  care of this patient. A copy of today's note is routed to referring provider    Earlie Server, MD, PhD 11/12/2021

## 2021-11-12 NOTE — Addendum Note (Signed)
Addended by: Rickard Patience on: 11/12/2021 08:15 PM   Modules accepted: Orders

## 2021-11-27 ENCOUNTER — Inpatient Hospital Stay: Payer: PPO

## 2021-11-27 ENCOUNTER — Telehealth: Payer: Self-pay | Admitting: Oncology

## 2021-11-27 NOTE — Telephone Encounter (Signed)
Son called in to reschedule pt's appt. For today. Call back at [phone]

## 2021-12-10 ENCOUNTER — Inpatient Hospital Stay: Payer: PPO

## 2021-12-10 ENCOUNTER — Other Ambulatory Visit: Payer: Self-pay

## 2021-12-10 VITALS — BP 93/62 | HR 65 | Temp 96.9°F | Resp 18

## 2021-12-10 DIAGNOSIS — D751 Secondary polycythemia: Secondary | ICD-10-CM | POA: Diagnosis not present

## 2021-12-10 LAB — HEMOGLOBIN AND HEMATOCRIT, BLOOD
HCT: 56.3 % — ABNORMAL HIGH (ref 39.0–52.0)
Hemoglobin: 18.4 g/dL — ABNORMAL HIGH (ref 13.0–17.0)

## 2021-12-10 MED ORDER — SODIUM CHLORIDE 0.9 % IV SOLN
Freq: Once | INTRAVENOUS | Status: AC
Start: 2021-12-10 — End: 2021-12-10
  Filled 2021-12-10: qty 250

## 2021-12-10 NOTE — Patient Instructions (Signed)

## 2021-12-10 NOTE — Progress Notes (Signed)
Removed 250 ml of blood per phlebotomy parameters. HCT 56. Pt is pleasantly confused. Fluid replacement wit 250 ml of NS post procedure. Patient tolerated well. Drank a ginger ale. Feeling well at time of discharge.Discharged via wheelchair accompanied by son.

## 2021-12-22 ENCOUNTER — Encounter: Payer: Self-pay | Admitting: Oncology

## 2021-12-23 ENCOUNTER — Encounter: Payer: Self-pay | Admitting: Oncology

## 2021-12-24 ENCOUNTER — Inpatient Hospital Stay: Payer: PPO

## 2021-12-24 ENCOUNTER — Inpatient Hospital Stay: Payer: PPO | Attending: Oncology

## 2021-12-24 ENCOUNTER — Other Ambulatory Visit: Payer: Self-pay

## 2021-12-24 DIAGNOSIS — D751 Secondary polycythemia: Secondary | ICD-10-CM | POA: Diagnosis not present

## 2021-12-24 LAB — HEMOGLOBIN AND HEMATOCRIT, BLOOD
HCT: 53.8 % — ABNORMAL HIGH (ref 39.0–52.0)
Hemoglobin: 17.5 g/dL — ABNORMAL HIGH (ref 13.0–17.0)

## 2021-12-24 NOTE — Progress Notes (Signed)
HCT below 55; No phlebotomy required today. Son made aware of results.

## 2021-12-25 DIAGNOSIS — N183 Chronic kidney disease, stage 3 unspecified: Secondary | ICD-10-CM | POA: Diagnosis not present

## 2021-12-25 DIAGNOSIS — G620 Drug-induced polyneuropathy: Secondary | ICD-10-CM | POA: Diagnosis not present

## 2021-12-25 DIAGNOSIS — J449 Chronic obstructive pulmonary disease, unspecified: Secondary | ICD-10-CM | POA: Diagnosis not present

## 2021-12-25 DIAGNOSIS — I25118 Atherosclerotic heart disease of native coronary artery with other forms of angina pectoris: Secondary | ICD-10-CM | POA: Diagnosis not present

## 2021-12-25 DIAGNOSIS — I739 Peripheral vascular disease, unspecified: Secondary | ICD-10-CM | POA: Diagnosis not present

## 2021-12-25 DIAGNOSIS — F0390 Unspecified dementia without behavioral disturbance: Secondary | ICD-10-CM | POA: Diagnosis not present

## 2021-12-25 DIAGNOSIS — Z7902 Long term (current) use of antithrombotics/antiplatelets: Secondary | ICD-10-CM | POA: Diagnosis not present

## 2021-12-25 DIAGNOSIS — F331 Major depressive disorder, recurrent, moderate: Secondary | ICD-10-CM | POA: Diagnosis not present

## 2021-12-25 DIAGNOSIS — Z515 Encounter for palliative care: Secondary | ICD-10-CM | POA: Diagnosis not present

## 2021-12-25 DIAGNOSIS — F17200 Nicotine dependence, unspecified, uncomplicated: Secondary | ICD-10-CM | POA: Diagnosis not present

## 2021-12-25 DIAGNOSIS — T466X5D Adverse effect of antihyperlipidemic and antiarteriosclerotic drugs, subsequent encounter: Secondary | ICD-10-CM | POA: Diagnosis not present

## 2021-12-25 DIAGNOSIS — I5020 Unspecified systolic (congestive) heart failure: Secondary | ICD-10-CM | POA: Diagnosis not present

## 2022-01-07 ENCOUNTER — Inpatient Hospital Stay: Payer: PPO

## 2022-01-07 ENCOUNTER — Other Ambulatory Visit: Payer: Self-pay

## 2022-01-07 DIAGNOSIS — D751 Secondary polycythemia: Secondary | ICD-10-CM

## 2022-01-07 LAB — HEMOGLOBIN AND HEMATOCRIT, BLOOD
HCT: 52.5 % — ABNORMAL HIGH (ref 39.0–52.0)
Hemoglobin: 17.1 g/dL — ABNORMAL HIGH (ref 13.0–17.0)

## 2022-01-07 NOTE — Progress Notes (Signed)
HCT below 55; 52.5 today. No phlebotomy required. Pt and son informed of above.

## 2022-01-21 ENCOUNTER — Inpatient Hospital Stay: Payer: PPO | Attending: Oncology

## 2022-01-21 ENCOUNTER — Inpatient Hospital Stay: Payer: PPO

## 2022-01-21 ENCOUNTER — Other Ambulatory Visit: Payer: Self-pay

## 2022-01-21 VITALS — BP 117/60 | HR 68 | Temp 96.0°F | Resp 17

## 2022-01-21 DIAGNOSIS — D751 Secondary polycythemia: Secondary | ICD-10-CM

## 2022-01-21 LAB — HEMOGLOBIN AND HEMATOCRIT, BLOOD
HCT: 52.4 % — ABNORMAL HIGH (ref 39.0–52.0)
Hemoglobin: 17.1 g/dL — ABNORMAL HIGH (ref 13.0–17.0)

## 2022-01-21 MED ORDER — SODIUM CHLORIDE 0.9 % IV SOLN
Freq: Once | INTRAVENOUS | Status: AC
  Filled 2022-01-21: qty 250

## 2022-01-21 NOTE — Progress Notes (Signed)
Patient HCT 52.4 today, RN overlooked parameter order, 25 cc was removed prior to being informed no phlebotomy. MD informed. 250 cc saline replaced. BP improved. Son & patient informed. No concerns voiced. Patient discharged, stable.

## 2022-01-21 NOTE — Patient Instructions (Signed)

## 2022-01-23 DIAGNOSIS — R001 Bradycardia, unspecified: Secondary | ICD-10-CM | POA: Diagnosis not present

## 2022-01-23 DIAGNOSIS — D751 Secondary polycythemia: Secondary | ICD-10-CM | POA: Diagnosis not present

## 2022-01-23 DIAGNOSIS — D696 Thrombocytopenia, unspecified: Secondary | ICD-10-CM | POA: Diagnosis not present

## 2022-01-23 DIAGNOSIS — Z743 Need for continuous supervision: Secondary | ICD-10-CM | POA: Diagnosis not present

## 2022-01-23 DIAGNOSIS — R32 Unspecified urinary incontinence: Secondary | ICD-10-CM | POA: Diagnosis not present

## 2022-01-23 DIAGNOSIS — U071 COVID-19: Secondary | ICD-10-CM | POA: Diagnosis not present

## 2022-01-23 DIAGNOSIS — F32A Depression, unspecified: Secondary | ICD-10-CM | POA: Diagnosis not present

## 2022-01-23 DIAGNOSIS — K592 Neurogenic bowel, not elsewhere classified: Secondary | ICD-10-CM | POA: Diagnosis not present

## 2022-01-23 DIAGNOSIS — R413 Other amnesia: Secondary | ICD-10-CM | POA: Diagnosis not present

## 2022-01-23 DIAGNOSIS — E7800 Pure hypercholesterolemia, unspecified: Secondary | ICD-10-CM | POA: Diagnosis not present

## 2022-01-23 DIAGNOSIS — I2510 Atherosclerotic heart disease of native coronary artery without angina pectoris: Secondary | ICD-10-CM | POA: Diagnosis not present

## 2022-01-23 DIAGNOSIS — R278 Other lack of coordination: Secondary | ICD-10-CM | POA: Diagnosis not present

## 2022-01-23 DIAGNOSIS — S0990XA Unspecified injury of head, initial encounter: Secondary | ICD-10-CM | POA: Diagnosis not present

## 2022-01-23 DIAGNOSIS — F0393 Unspecified dementia, unspecified severity, with mood disturbance: Secondary | ICD-10-CM | POA: Diagnosis not present

## 2022-01-23 DIAGNOSIS — Y92009 Unspecified place in unspecified non-institutional (private) residence as the place of occurrence of the external cause: Secondary | ICD-10-CM | POA: Diagnosis not present

## 2022-01-23 DIAGNOSIS — J449 Chronic obstructive pulmonary disease, unspecified: Secondary | ICD-10-CM | POA: Diagnosis not present

## 2022-01-23 DIAGNOSIS — I252 Old myocardial infarction: Secondary | ICD-10-CM | POA: Diagnosis not present

## 2022-01-23 DIAGNOSIS — E1122 Type 2 diabetes mellitus with diabetic chronic kidney disease: Secondary | ICD-10-CM | POA: Diagnosis not present

## 2022-01-23 DIAGNOSIS — I5023 Acute on chronic systolic (congestive) heart failure: Secondary | ICD-10-CM | POA: Diagnosis not present

## 2022-01-23 DIAGNOSIS — Z9183 Wandering in diseases classified elsewhere: Secondary | ICD-10-CM | POA: Diagnosis not present

## 2022-01-23 DIAGNOSIS — I498 Other specified cardiac arrhythmias: Secondary | ICD-10-CM | POA: Diagnosis not present

## 2022-01-23 DIAGNOSIS — W19XXXA Unspecified fall, initial encounter: Secondary | ICD-10-CM | POA: Diagnosis not present

## 2022-01-23 DIAGNOSIS — K219 Gastro-esophageal reflux disease without esophagitis: Secondary | ICD-10-CM | POA: Diagnosis not present

## 2022-01-23 DIAGNOSIS — E039 Hypothyroidism, unspecified: Secondary | ICD-10-CM | POA: Diagnosis not present

## 2022-01-23 DIAGNOSIS — D631 Anemia in chronic kidney disease: Secondary | ICD-10-CM | POA: Diagnosis not present

## 2022-01-23 DIAGNOSIS — I509 Heart failure, unspecified: Secondary | ICD-10-CM | POA: Diagnosis not present

## 2022-01-23 DIAGNOSIS — I5022 Chronic systolic (congestive) heart failure: Secondary | ICD-10-CM | POA: Diagnosis not present

## 2022-01-23 DIAGNOSIS — F0390 Unspecified dementia without behavioral disturbance: Secondary | ICD-10-CM | POA: Diagnosis not present

## 2022-01-23 DIAGNOSIS — R159 Full incontinence of feces: Secondary | ICD-10-CM | POA: Diagnosis not present

## 2022-01-23 DIAGNOSIS — F5101 Primary insomnia: Secondary | ICD-10-CM | POA: Diagnosis not present

## 2022-01-23 DIAGNOSIS — I130 Hypertensive heart and chronic kidney disease with heart failure and stage 1 through stage 4 chronic kidney disease, or unspecified chronic kidney disease: Secondary | ICD-10-CM | POA: Diagnosis not present

## 2022-01-23 DIAGNOSIS — I21A1 Myocardial infarction type 2: Secondary | ICD-10-CM | POA: Diagnosis not present

## 2022-01-23 DIAGNOSIS — I255 Ischemic cardiomyopathy: Secondary | ICD-10-CM | POA: Diagnosis not present

## 2022-01-23 DIAGNOSIS — I493 Ventricular premature depolarization: Secondary | ICD-10-CM | POA: Diagnosis not present

## 2022-01-23 DIAGNOSIS — R109 Unspecified abdominal pain: Secondary | ICD-10-CM | POA: Diagnosis not present

## 2022-01-23 DIAGNOSIS — G309 Alzheimer's disease, unspecified: Secondary | ICD-10-CM | POA: Diagnosis not present

## 2022-01-23 DIAGNOSIS — Z8616 Personal history of COVID-19: Secondary | ICD-10-CM | POA: Diagnosis not present

## 2022-01-23 DIAGNOSIS — N183 Chronic kidney disease, stage 3 unspecified: Secondary | ICD-10-CM | POA: Diagnosis not present

## 2022-01-23 DIAGNOSIS — I10 Essential (primary) hypertension: Secondary | ICD-10-CM | POA: Diagnosis not present

## 2022-01-23 DIAGNOSIS — R531 Weakness: Secondary | ICD-10-CM | POA: Diagnosis not present

## 2022-01-23 DIAGNOSIS — J208 Acute bronchitis due to other specified organisms: Secondary | ICD-10-CM | POA: Diagnosis not present

## 2022-01-23 DIAGNOSIS — Z7984 Long term (current) use of oral hypoglycemic drugs: Secondary | ICD-10-CM | POA: Diagnosis not present

## 2022-01-23 DIAGNOSIS — I214 Non-ST elevation (NSTEMI) myocardial infarction: Secondary | ICD-10-CM | POA: Diagnosis not present

## 2022-01-23 DIAGNOSIS — I110 Hypertensive heart disease with heart failure: Secondary | ICD-10-CM | POA: Diagnosis not present

## 2022-01-23 DIAGNOSIS — F418 Other specified anxiety disorders: Secondary | ICD-10-CM | POA: Diagnosis not present

## 2022-01-23 DIAGNOSIS — E876 Hypokalemia: Secondary | ICD-10-CM | POA: Diagnosis not present

## 2022-01-23 DIAGNOSIS — F17210 Nicotine dependence, cigarettes, uncomplicated: Secondary | ICD-10-CM | POA: Diagnosis not present

## 2022-01-23 DIAGNOSIS — R008 Other abnormalities of heart beat: Secondary | ICD-10-CM | POA: Diagnosis not present

## 2022-01-23 DIAGNOSIS — F17220 Nicotine dependence, chewing tobacco, uncomplicated: Secondary | ICD-10-CM | POA: Diagnosis not present

## 2022-01-23 DIAGNOSIS — F411 Generalized anxiety disorder: Secondary | ICD-10-CM | POA: Diagnosis not present

## 2022-01-24 DIAGNOSIS — I255 Ischemic cardiomyopathy: Secondary | ICD-10-CM | POA: Diagnosis not present

## 2022-01-24 DIAGNOSIS — I214 Non-ST elevation (NSTEMI) myocardial infarction: Secondary | ICD-10-CM | POA: Diagnosis not present

## 2022-01-24 DIAGNOSIS — U071 COVID-19: Secondary | ICD-10-CM | POA: Diagnosis not present

## 2022-01-25 DIAGNOSIS — I10 Essential (primary) hypertension: Secondary | ICD-10-CM | POA: Diagnosis not present

## 2022-01-25 DIAGNOSIS — U071 COVID-19: Secondary | ICD-10-CM | POA: Diagnosis not present

## 2022-01-25 DIAGNOSIS — I255 Ischemic cardiomyopathy: Secondary | ICD-10-CM | POA: Diagnosis not present

## 2022-01-25 DIAGNOSIS — I5023 Acute on chronic systolic (congestive) heart failure: Secondary | ICD-10-CM | POA: Diagnosis not present

## 2022-01-25 DIAGNOSIS — R413 Other amnesia: Secondary | ICD-10-CM | POA: Diagnosis not present

## 2022-01-25 DIAGNOSIS — J449 Chronic obstructive pulmonary disease, unspecified: Secondary | ICD-10-CM | POA: Diagnosis not present

## 2022-01-26 DIAGNOSIS — J449 Chronic obstructive pulmonary disease, unspecified: Secondary | ICD-10-CM | POA: Diagnosis not present

## 2022-01-26 DIAGNOSIS — I10 Essential (primary) hypertension: Secondary | ICD-10-CM | POA: Diagnosis not present

## 2022-01-26 DIAGNOSIS — I5023 Acute on chronic systolic (congestive) heart failure: Secondary | ICD-10-CM | POA: Diagnosis not present

## 2022-01-26 DIAGNOSIS — R413 Other amnesia: Secondary | ICD-10-CM | POA: Diagnosis not present

## 2022-01-26 DIAGNOSIS — I255 Ischemic cardiomyopathy: Secondary | ICD-10-CM | POA: Diagnosis not present

## 2022-01-26 DIAGNOSIS — U071 COVID-19: Secondary | ICD-10-CM | POA: Diagnosis not present

## 2022-01-27 DIAGNOSIS — I255 Ischemic cardiomyopathy: Secondary | ICD-10-CM | POA: Diagnosis not present

## 2022-01-27 DIAGNOSIS — I10 Essential (primary) hypertension: Secondary | ICD-10-CM | POA: Diagnosis not present

## 2022-01-27 DIAGNOSIS — R413 Other amnesia: Secondary | ICD-10-CM | POA: Diagnosis not present

## 2022-01-27 DIAGNOSIS — I5023 Acute on chronic systolic (congestive) heart failure: Secondary | ICD-10-CM | POA: Diagnosis not present

## 2022-01-27 DIAGNOSIS — J449 Chronic obstructive pulmonary disease, unspecified: Secondary | ICD-10-CM | POA: Diagnosis not present

## 2022-01-27 DIAGNOSIS — U071 COVID-19: Secondary | ICD-10-CM | POA: Diagnosis not present

## 2022-01-28 DIAGNOSIS — U071 COVID-19: Secondary | ICD-10-CM | POA: Diagnosis not present

## 2022-01-28 DIAGNOSIS — I10 Essential (primary) hypertension: Secondary | ICD-10-CM | POA: Diagnosis not present

## 2022-01-28 DIAGNOSIS — J449 Chronic obstructive pulmonary disease, unspecified: Secondary | ICD-10-CM | POA: Diagnosis not present

## 2022-01-28 DIAGNOSIS — R413 Other amnesia: Secondary | ICD-10-CM | POA: Diagnosis not present

## 2022-01-28 DIAGNOSIS — I5023 Acute on chronic systolic (congestive) heart failure: Secondary | ICD-10-CM | POA: Diagnosis not present

## 2022-01-28 DIAGNOSIS — I255 Ischemic cardiomyopathy: Secondary | ICD-10-CM | POA: Diagnosis not present

## 2022-01-29 DIAGNOSIS — U071 COVID-19: Secondary | ICD-10-CM | POA: Diagnosis not present

## 2022-01-29 DIAGNOSIS — R413 Other amnesia: Secondary | ICD-10-CM | POA: Diagnosis not present

## 2022-01-29 DIAGNOSIS — I10 Essential (primary) hypertension: Secondary | ICD-10-CM | POA: Diagnosis not present

## 2022-01-29 DIAGNOSIS — I255 Ischemic cardiomyopathy: Secondary | ICD-10-CM | POA: Diagnosis not present

## 2022-01-29 DIAGNOSIS — J449 Chronic obstructive pulmonary disease, unspecified: Secondary | ICD-10-CM | POA: Diagnosis not present

## 2022-01-29 DIAGNOSIS — I5023 Acute on chronic systolic (congestive) heart failure: Secondary | ICD-10-CM | POA: Diagnosis not present

## 2022-01-30 DIAGNOSIS — I10 Essential (primary) hypertension: Secondary | ICD-10-CM | POA: Diagnosis not present

## 2022-01-30 DIAGNOSIS — I5023 Acute on chronic systolic (congestive) heart failure: Secondary | ICD-10-CM | POA: Diagnosis not present

## 2022-01-30 DIAGNOSIS — R413 Other amnesia: Secondary | ICD-10-CM | POA: Diagnosis not present

## 2022-01-30 DIAGNOSIS — J449 Chronic obstructive pulmonary disease, unspecified: Secondary | ICD-10-CM | POA: Diagnosis not present

## 2022-01-30 DIAGNOSIS — U071 COVID-19: Secondary | ICD-10-CM | POA: Diagnosis not present

## 2022-01-30 DIAGNOSIS — I255 Ischemic cardiomyopathy: Secondary | ICD-10-CM | POA: Diagnosis not present

## 2022-01-31 DIAGNOSIS — J449 Chronic obstructive pulmonary disease, unspecified: Secondary | ICD-10-CM | POA: Diagnosis not present

## 2022-01-31 DIAGNOSIS — I255 Ischemic cardiomyopathy: Secondary | ICD-10-CM | POA: Diagnosis not present

## 2022-01-31 DIAGNOSIS — I5023 Acute on chronic systolic (congestive) heart failure: Secondary | ICD-10-CM | POA: Diagnosis not present

## 2022-01-31 DIAGNOSIS — I10 Essential (primary) hypertension: Secondary | ICD-10-CM | POA: Diagnosis not present

## 2022-01-31 DIAGNOSIS — U071 COVID-19: Secondary | ICD-10-CM | POA: Diagnosis not present

## 2022-01-31 DIAGNOSIS — R413 Other amnesia: Secondary | ICD-10-CM | POA: Diagnosis not present

## 2022-02-01 DIAGNOSIS — I255 Ischemic cardiomyopathy: Secondary | ICD-10-CM | POA: Diagnosis not present

## 2022-02-01 DIAGNOSIS — J449 Chronic obstructive pulmonary disease, unspecified: Secondary | ICD-10-CM | POA: Diagnosis not present

## 2022-02-01 DIAGNOSIS — R001 Bradycardia, unspecified: Secondary | ICD-10-CM | POA: Diagnosis not present

## 2022-02-01 DIAGNOSIS — I5023 Acute on chronic systolic (congestive) heart failure: Secondary | ICD-10-CM | POA: Diagnosis not present

## 2022-02-02 DIAGNOSIS — I255 Ischemic cardiomyopathy: Secondary | ICD-10-CM | POA: Diagnosis not present

## 2022-02-02 DIAGNOSIS — J449 Chronic obstructive pulmonary disease, unspecified: Secondary | ICD-10-CM | POA: Diagnosis not present

## 2022-02-02 DIAGNOSIS — I5023 Acute on chronic systolic (congestive) heart failure: Secondary | ICD-10-CM | POA: Diagnosis not present

## 2022-02-02 DIAGNOSIS — R001 Bradycardia, unspecified: Secondary | ICD-10-CM | POA: Diagnosis not present

## 2022-02-03 DIAGNOSIS — F17210 Nicotine dependence, cigarettes, uncomplicated: Secondary | ICD-10-CM | POA: Diagnosis not present

## 2022-02-03 DIAGNOSIS — Z8616 Personal history of COVID-19: Secondary | ICD-10-CM | POA: Diagnosis not present

## 2022-02-03 DIAGNOSIS — J449 Chronic obstructive pulmonary disease, unspecified: Secondary | ICD-10-CM | POA: Diagnosis not present

## 2022-02-03 DIAGNOSIS — D696 Thrombocytopenia, unspecified: Secondary | ICD-10-CM | POA: Diagnosis not present

## 2022-02-03 DIAGNOSIS — I214 Non-ST elevation (NSTEMI) myocardial infarction: Secondary | ICD-10-CM | POA: Diagnosis not present

## 2022-02-03 DIAGNOSIS — R278 Other lack of coordination: Secondary | ICD-10-CM | POA: Diagnosis not present

## 2022-02-03 DIAGNOSIS — R001 Bradycardia, unspecified: Secondary | ICD-10-CM | POA: Diagnosis not present

## 2022-02-03 DIAGNOSIS — I498 Other specified cardiac arrhythmias: Secondary | ICD-10-CM | POA: Diagnosis not present

## 2022-02-03 DIAGNOSIS — E1122 Type 2 diabetes mellitus with diabetic chronic kidney disease: Secondary | ICD-10-CM | POA: Diagnosis not present

## 2022-02-03 DIAGNOSIS — U071 COVID-19: Secondary | ICD-10-CM | POA: Diagnosis not present

## 2022-02-03 DIAGNOSIS — Z9183 Wandering in diseases classified elsewhere: Secondary | ICD-10-CM | POA: Diagnosis not present

## 2022-02-03 DIAGNOSIS — D751 Secondary polycythemia: Secondary | ICD-10-CM | POA: Diagnosis not present

## 2022-02-03 DIAGNOSIS — G309 Alzheimer's disease, unspecified: Secondary | ICD-10-CM | POA: Diagnosis not present

## 2022-02-03 DIAGNOSIS — I2510 Atherosclerotic heart disease of native coronary artery without angina pectoris: Secondary | ICD-10-CM | POA: Diagnosis not present

## 2022-02-03 DIAGNOSIS — I5023 Acute on chronic systolic (congestive) heart failure: Secondary | ICD-10-CM | POA: Diagnosis not present

## 2022-02-03 DIAGNOSIS — F0393 Unspecified dementia, unspecified severity, with mood disturbance: Secondary | ICD-10-CM | POA: Diagnosis not present

## 2022-02-03 DIAGNOSIS — E7800 Pure hypercholesterolemia, unspecified: Secondary | ICD-10-CM | POA: Diagnosis not present

## 2022-02-03 DIAGNOSIS — J208 Acute bronchitis due to other specified organisms: Secondary | ICD-10-CM | POA: Diagnosis not present

## 2022-02-03 DIAGNOSIS — N183 Chronic kidney disease, stage 3 unspecified: Secondary | ICD-10-CM | POA: Diagnosis not present

## 2022-02-03 DIAGNOSIS — E039 Hypothyroidism, unspecified: Secondary | ICD-10-CM | POA: Diagnosis not present

## 2022-02-03 DIAGNOSIS — Z743 Need for continuous supervision: Secondary | ICD-10-CM | POA: Diagnosis not present

## 2022-02-03 DIAGNOSIS — Z7984 Long term (current) use of oral hypoglycemic drugs: Secondary | ICD-10-CM | POA: Diagnosis not present

## 2022-02-03 DIAGNOSIS — E109 Type 1 diabetes mellitus without complications: Secondary | ICD-10-CM | POA: Diagnosis not present

## 2022-02-03 DIAGNOSIS — I252 Old myocardial infarction: Secondary | ICD-10-CM | POA: Diagnosis not present

## 2022-02-03 DIAGNOSIS — I5022 Chronic systolic (congestive) heart failure: Secondary | ICD-10-CM | POA: Diagnosis not present

## 2022-02-03 DIAGNOSIS — F418 Other specified anxiety disorders: Secondary | ICD-10-CM | POA: Diagnosis not present

## 2022-02-03 DIAGNOSIS — I130 Hypertensive heart and chronic kidney disease with heart failure and stage 1 through stage 4 chronic kidney disease, or unspecified chronic kidney disease: Secondary | ICD-10-CM | POA: Diagnosis not present

## 2022-02-03 DIAGNOSIS — I255 Ischemic cardiomyopathy: Secondary | ICD-10-CM | POA: Diagnosis not present

## 2022-02-03 DIAGNOSIS — F0390 Unspecified dementia without behavioral disturbance: Secondary | ICD-10-CM | POA: Diagnosis not present

## 2022-02-04 ENCOUNTER — Inpatient Hospital Stay: Payer: PPO

## 2022-02-04 ENCOUNTER — Ambulatory Visit: Payer: PPO | Admitting: Oncology

## 2022-02-04 ENCOUNTER — Inpatient Hospital Stay: Payer: PPO | Admitting: Nurse Practitioner

## 2022-02-04 DIAGNOSIS — N183 Chronic kidney disease, stage 3 unspecified: Secondary | ICD-10-CM | POA: Diagnosis not present

## 2022-02-04 DIAGNOSIS — J449 Chronic obstructive pulmonary disease, unspecified: Secondary | ICD-10-CM | POA: Diagnosis not present

## 2022-02-04 DIAGNOSIS — D751 Secondary polycythemia: Secondary | ICD-10-CM | POA: Diagnosis not present

## 2022-02-04 DIAGNOSIS — F0390 Unspecified dementia without behavioral disturbance: Secondary | ICD-10-CM | POA: Diagnosis not present

## 2022-02-04 DIAGNOSIS — I5022 Chronic systolic (congestive) heart failure: Secondary | ICD-10-CM | POA: Diagnosis not present

## 2022-02-04 DIAGNOSIS — I498 Other specified cardiac arrhythmias: Secondary | ICD-10-CM | POA: Diagnosis not present

## 2022-02-04 DIAGNOSIS — U071 COVID-19: Secondary | ICD-10-CM | POA: Diagnosis not present

## 2022-02-04 DIAGNOSIS — J208 Acute bronchitis due to other specified organisms: Secondary | ICD-10-CM | POA: Diagnosis not present

## 2022-02-10 DIAGNOSIS — I130 Hypertensive heart and chronic kidney disease with heart failure and stage 1 through stage 4 chronic kidney disease, or unspecified chronic kidney disease: Secondary | ICD-10-CM | POA: Diagnosis not present

## 2022-02-16 DIAGNOSIS — Z515 Encounter for palliative care: Secondary | ICD-10-CM | POA: Diagnosis not present

## 2022-02-16 DIAGNOSIS — I5020 Unspecified systolic (congestive) heart failure: Secondary | ICD-10-CM | POA: Diagnosis not present

## 2022-02-16 DIAGNOSIS — F03911 Unspecified dementia, unspecified severity, with agitation: Secondary | ICD-10-CM | POA: Diagnosis not present

## 2022-02-18 DIAGNOSIS — I130 Hypertensive heart and chronic kidney disease with heart failure and stage 1 through stage 4 chronic kidney disease, or unspecified chronic kidney disease: Secondary | ICD-10-CM | POA: Diagnosis not present

## 2022-05-13 DEATH — deceased

## 2022-08-09 ENCOUNTER — Encounter: Payer: Self-pay | Admitting: Oncology
